# Patient Record
Sex: Female | Born: 1975 | Hispanic: Yes | Marital: Married | Smoking: Never smoker
Health system: Southern US, Community
[De-identification: ages and names within clinical notes are randomized; demographics above are authoritative.]

## PROBLEM LIST (undated history)

## (undated) DIAGNOSIS — D649 Anemia, unspecified: Secondary | ICD-10-CM

## (undated) HISTORY — PX: TUBAL LIGATION: SHX77

## (undated) HISTORY — PX: CHOLECYSTECTOMY: SHX55

---

## 2004-07-23 ENCOUNTER — Inpatient Hospital Stay (HOSPITAL_COMMUNITY): Admission: RE | Admit: 2004-07-23 | Discharge: 2004-07-25 | Payer: Self-pay | Admitting: Obstetrics and Gynecology

## 2006-09-04 ENCOUNTER — Emergency Department (HOSPITAL_COMMUNITY): Admission: EM | Admit: 2006-09-04 | Discharge: 2006-09-04 | Payer: Self-pay | Admitting: Emergency Medicine

## 2009-10-09 ENCOUNTER — Ambulatory Visit (HOSPITAL_COMMUNITY): Admission: RE | Admit: 2009-10-09 | Discharge: 2009-10-09 | Payer: Self-pay | Admitting: General Surgery

## 2009-10-15 ENCOUNTER — Encounter (INDEPENDENT_AMBULATORY_CARE_PROVIDER_SITE_OTHER): Payer: Self-pay | Admitting: General Surgery

## 2009-10-15 ENCOUNTER — Observation Stay (HOSPITAL_COMMUNITY): Admission: RE | Admit: 2009-10-15 | Discharge: 2009-10-16 | Payer: Self-pay | Admitting: General Surgery

## 2010-02-09 ENCOUNTER — Encounter (HOSPITAL_COMMUNITY): Admission: RE | Admit: 2010-02-09 | Discharge: 2010-03-11 | Payer: Self-pay | Admitting: Oncology

## 2010-02-09 ENCOUNTER — Ambulatory Visit (HOSPITAL_COMMUNITY): Payer: Self-pay | Admitting: Oncology

## 2010-04-06 ENCOUNTER — Encounter (HOSPITAL_COMMUNITY): Admission: RE | Admit: 2010-04-06 | Discharge: 2010-05-06 | Payer: Self-pay | Admitting: Oncology

## 2010-04-06 ENCOUNTER — Ambulatory Visit (HOSPITAL_COMMUNITY): Payer: Self-pay | Admitting: Oncology

## 2010-04-27 IMAGING — US US ABDOMEN COMPLETE
1 series · 14 of 25 positions shown · non-contrast
Comparison: None.

CLINICAL DATA: Post prandial pain with nausea and vomiting

ABDOMEN ULTRASOUND
TECHNIQUE: Routine

[Series 1: unknown · 0.26mm/px · 14 of 72 slices shown]
[im 1/72]
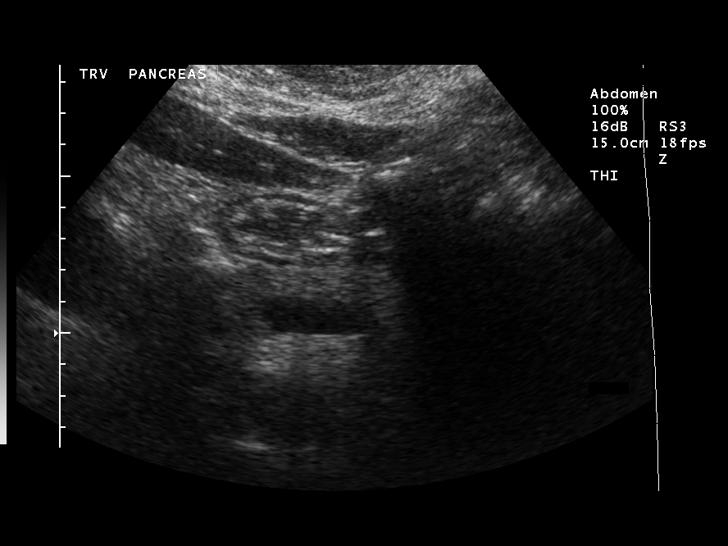
[im 6/72]
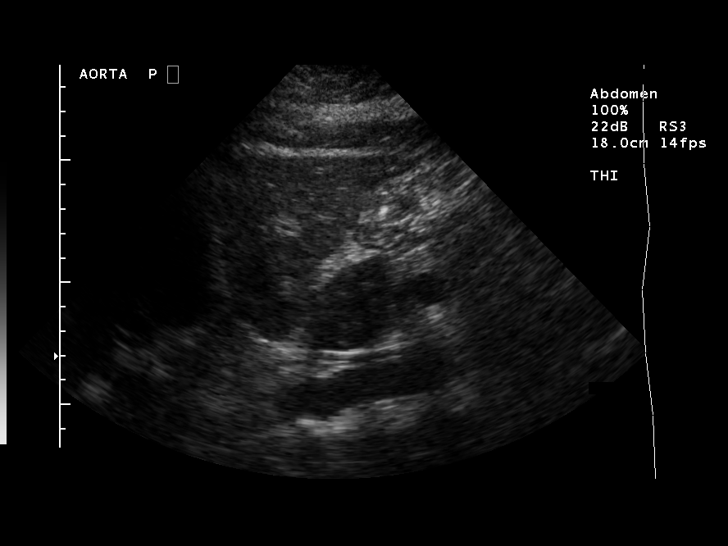
[im 12/72]
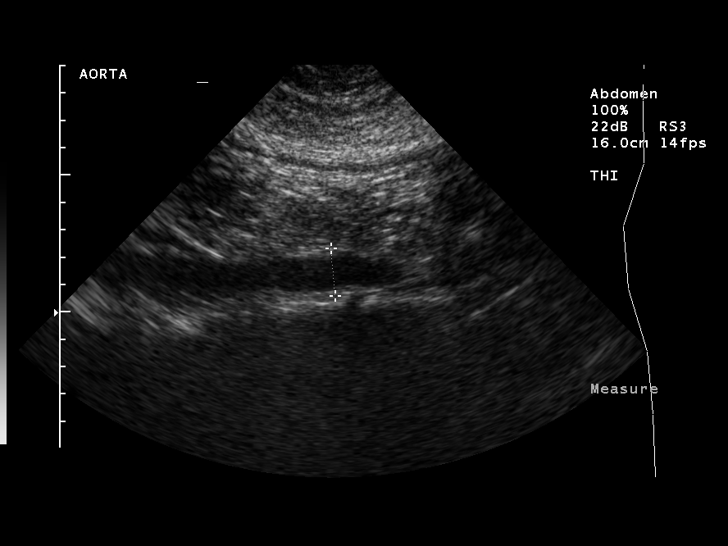
[im 18/72]
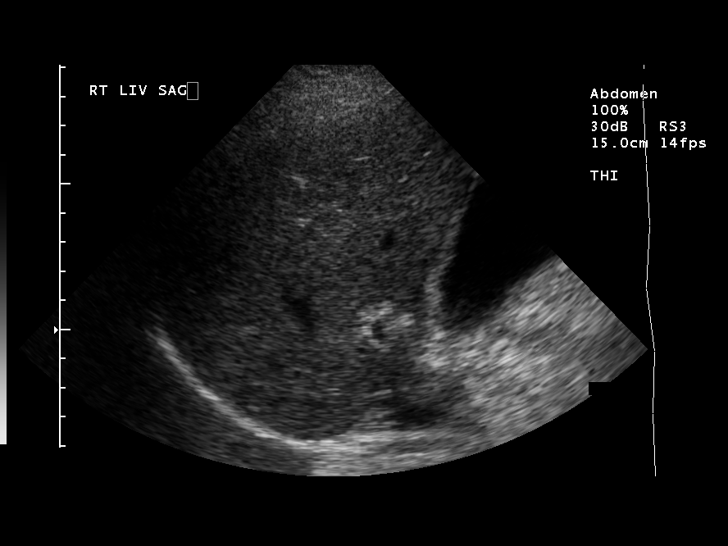
[im 24/72]
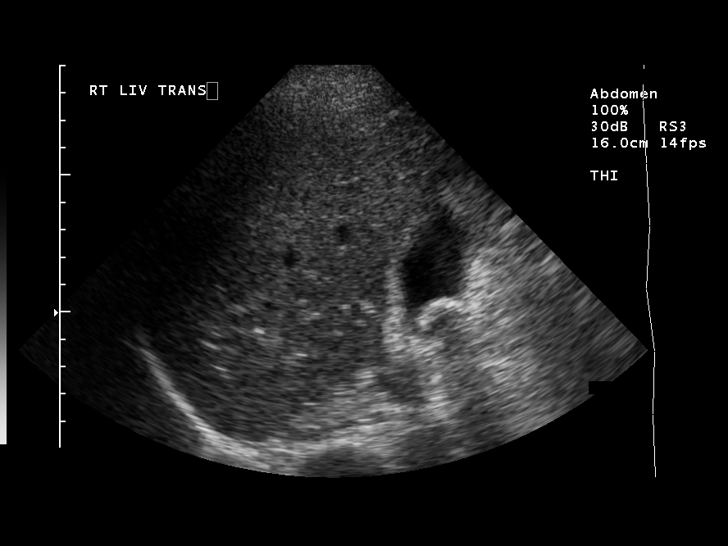
[im 27/72]
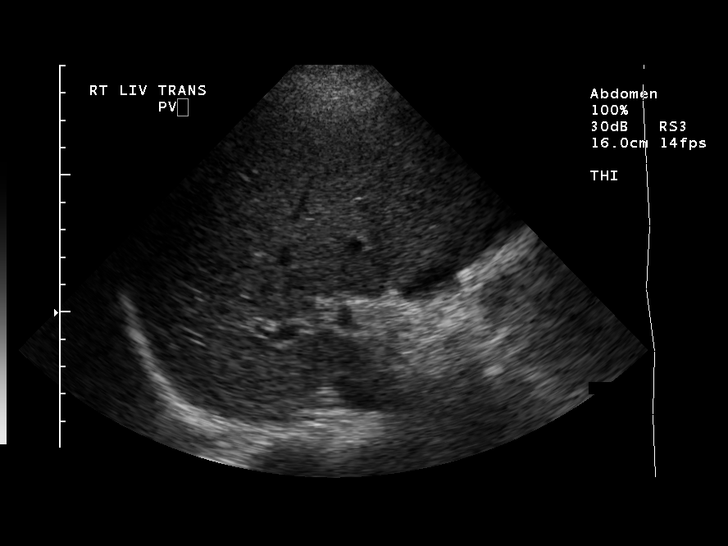
[im 33/72]
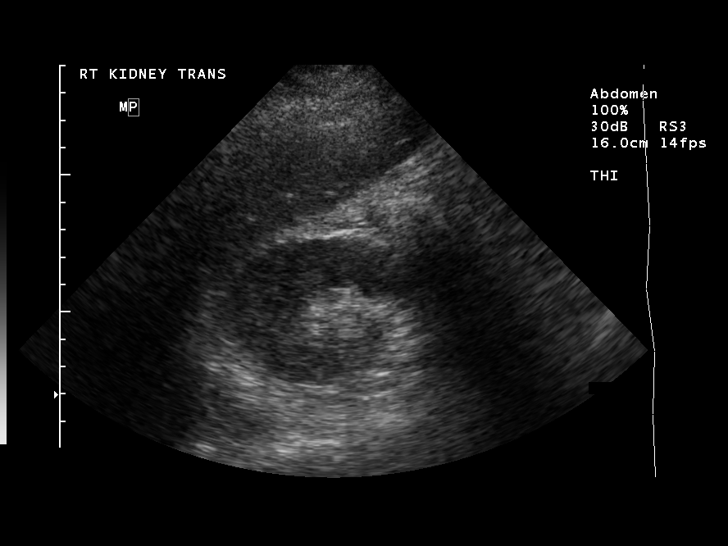
[im 39/72]
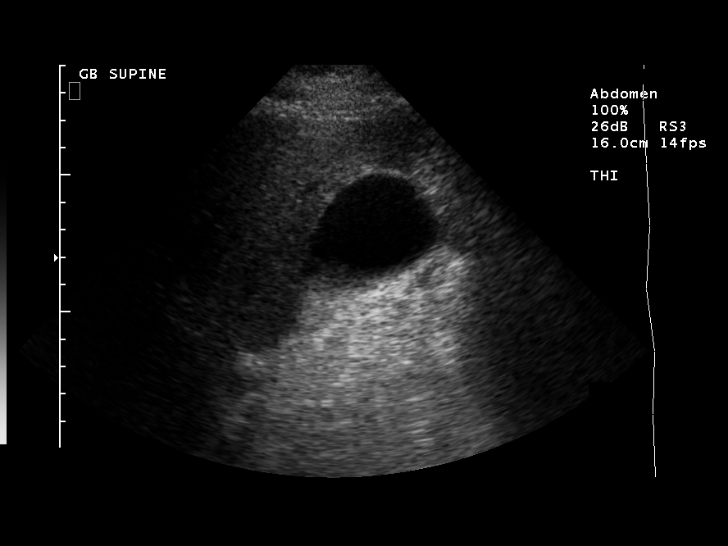
[im 45/72]
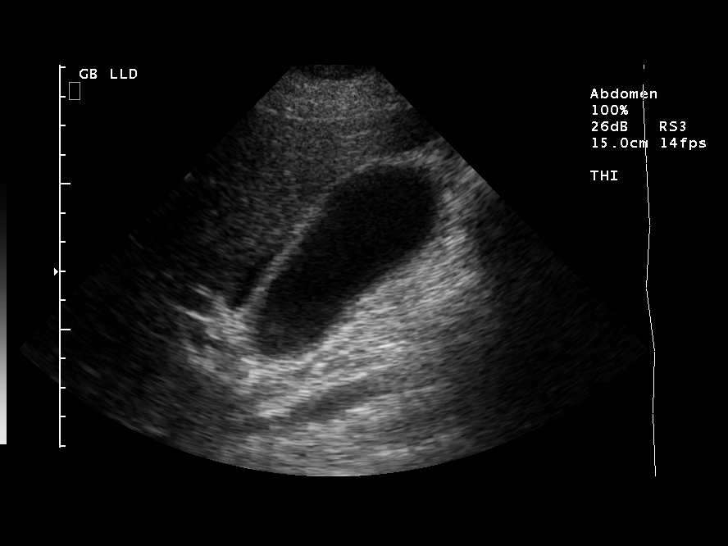
[im 48/72]
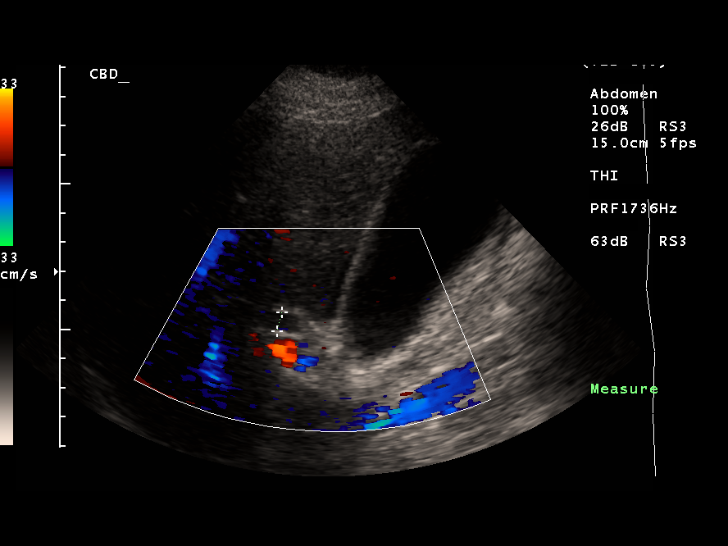
[im 54/72]
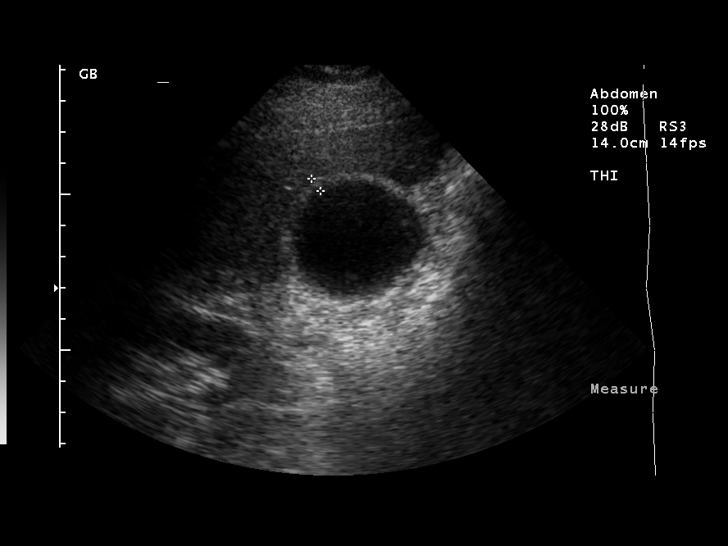
[im 60/72]
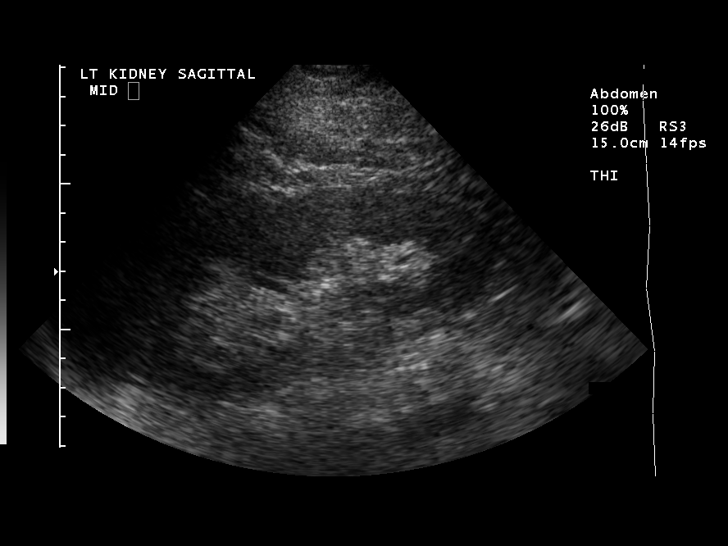
[im 66/72]
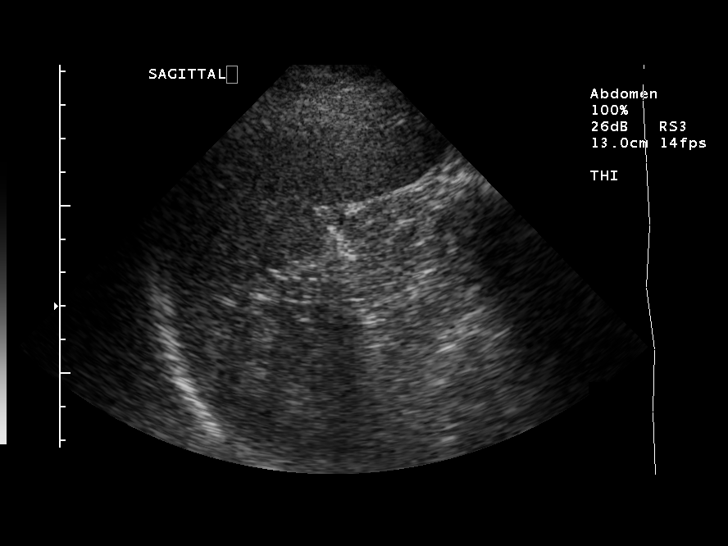
[im 72/72]
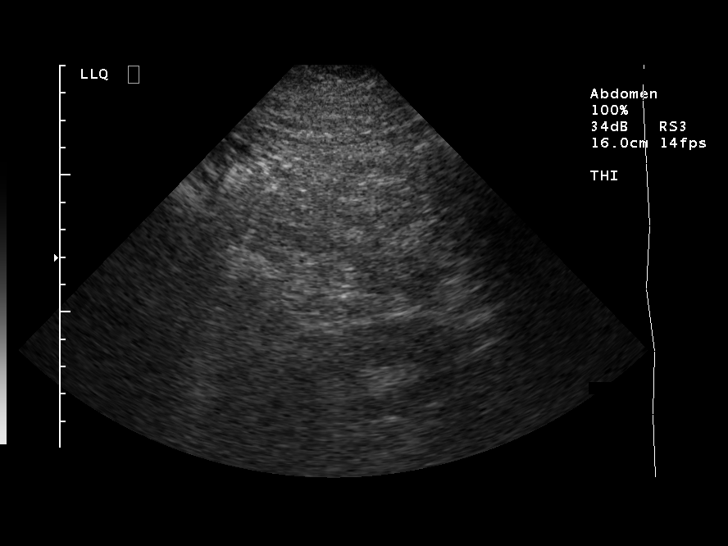

[14 of 25 positions shown; findings below may reference images not displayed]

FINDINGS: There is a 1.7 cm shadowing stone lodged in the neck of
the gallbladder.  There is wall thickening and pericholecystic
fluid.  There is a positive sonographic Murphy's sign.  The common
duct measures 6.7 mm which is slightly enlarged.  No obvious common
duct stone is visualized.  There also is slight dilatation of the
intrahepatic biliary tree.

No focal lesions of the liver or spleen.  Pancreas, aorta, IVC, and
kidneys normal.

These findings were personally phoned to the referring physician.
IMPRESSION: 1.  Cholelithiasis with sonographic changes of acute cholecystitis
as described above.
2.  There is mild intra and extrahepatic biliary dilatation without
demonstration of a common duct stone.

## 2010-08-13 ENCOUNTER — Encounter (HOSPITAL_COMMUNITY): Admission: RE | Admit: 2010-08-13 | Payer: Self-pay | Admitting: Oncology

## 2010-12-27 LAB — HEPATIC FUNCTION PANEL
Albumin: 3.1 g/dL — ABNORMAL LOW (ref 3.5–5.2)
Alkaline Phosphatase: 83 U/L (ref 39–117)
Total Protein: 7 g/dL (ref 6.0–8.3)

## 2010-12-27 LAB — POCT I-STAT 4, (NA,K, GLUC, HGB,HCT)
HCT: 32 % — ABNORMAL LOW (ref 36.0–46.0)
Hemoglobin: 10.9 g/dL — ABNORMAL LOW (ref 12.0–15.0)
Potassium: 4.4 mEq/L (ref 3.5–5.1)
Sodium: 140 mEq/L (ref 135–145)

## 2010-12-27 LAB — CBC
HCT: 33.6 % — ABNORMAL LOW (ref 36.0–46.0)
Hemoglobin: 11.3 g/dL — ABNORMAL LOW (ref 12.0–15.0)
MCV: 63 fL — ABNORMAL LOW (ref 78.0–100.0)
MCV: 73.3 fL — ABNORMAL LOW (ref 78.0–100.0)
Platelets: 323 10*3/uL (ref 150–400)
RBC: 4.59 MIL/uL (ref 3.87–5.11)
RDW: 30.7 % — ABNORMAL HIGH (ref 11.5–15.5)
WBC: 7.3 10*3/uL (ref 4.0–10.5)
WBC: 7 10*3/uL (ref 4.0–10.5)

## 2010-12-27 LAB — BASIC METABOLIC PANEL
BUN: 4 mg/dL — ABNORMAL LOW (ref 6–23)
Creatinine, Ser: 0.48 mg/dL (ref 0.4–1.2)
GFR calc non Af Amer: >60 mL/min (ref >60)

## 2010-12-27 LAB — DIFFERENTIAL
Basophils Absolute: 0 10*3/uL (ref 0.0–0.1)
Eosinophils Absolute: 0 10*3/uL (ref 0.0–0.7)
Lymphs Abs: 0.9 10*3/uL (ref 0.7–4.0)
Neutrophils Relative %: 77 % (ref 43–77)

## 2010-12-29 LAB — CBC
HCT: 24.8 % — ABNORMAL LOW (ref 36.0–46.0)
Hemoglobin: 8.1 g/dL — ABNORMAL LOW (ref 12.0–15.0)
MCV: 59 fL — ABNORMAL LOW (ref 78.0–100.0)
RBC: 4.19 MIL/uL (ref 3.87–5.11)
WBC: 5.8 10*3/uL (ref 4.0–10.5)

## 2010-12-29 LAB — FOLATE: Folate: 14.8 ng/mL

## 2010-12-29 LAB — RETICULOCYTES
RBC.: 4.19 MIL/uL (ref 3.87–5.11)
Retic Ct Pct: 1.7 % (ref 0.4–3.1)

## 2010-12-29 LAB — IRON AND TIBC
Saturation Ratios: 3 % — ABNORMAL LOW (ref 20–55)
UIBC: 520 ug/dL

## 2010-12-29 LAB — FERRITIN: Ferritin: 2 ng/mL — ABNORMAL LOW (ref 10–291)

## 2011-02-26 NOTE — H&P (Signed)
NAME:  Jasmine Townsend, Jasmine Townsend         ACCOUNT NO.:  [id number]   MEDICAL RECORD NO.:  [id number]           PATIENT TYPE:   LOCATION:                                 FACILITY:   PHYSICIAN:  Tilda Burrow, M.D.      DATE OF BIRTH:   DATE OF ADMISSION:  DATE OF DISCHARGE:  LH                                HISTORY & PHYSICAL   ADMITTING DIAGNOSES:  1.  Pregnancy, 38-1/[redacted] weeks gestation.  2.  Prior cesarean section, not for trial of labor.  3.  Desire for elective permanent sterilization.   HISTORY:  A Spanish speaking patient only.  This is a 35 year old female,  gravida 4, para 3, AB 0 with two vaginal deliveries in [place], followed by  cesarean section under spinal anesthesia in 2001, with no records available,  was admitted for repeat cesarean section and tubal ligation.  Ms. Jasmine Townsend has  been followed through our office, with last menstrual period ?January 16,  suggesting October 21 [facility].  A 15-week ultrasound showed an [facility] of July 28, 2004, and 23-week ultrasound suggests July 24, 2004.  The patient is  admitted at 39+ weeks by the ultrasound criteria for repeat cesarean section  and tubal ligation.  She requests permanent sterilization and on more than  one occasion, we have confirmed her understanding that the procedure is  permanent, and involves cutting the tubes.  (cortada de los trompas).   ALLERGIES:  She has no allergies.   SOCIAL HISTORY:  She is married.  Stable relationship to Jasmine Townsend.   PRENATAL COURSE:  Notable for blood type O positive.  GBS negative.  Rubella  immune.   LABORATORY DATA:  Hemoglobin 12, hematocrit 39, hepatitis, HIV, GC chlamydia  and RPR are all negative.  Pap smear normal.  Glucose tolerance test 123 mg  percent.   PHYSICAL EXAMINATION:  Shows term-size fetus, vertex presentation, cervix is  closed, long, vertex presentation confirmed.  Extremities are within normal  limits.  Fetal movement is reported by the patient  as good.   PLAN:  Repeat cesarean section and tubal ligation, July 23, 2004.                                                           Tilda Burrow, M.D.  Electronically Signed    JVF/MEDQ  D:  07/21/2004  T:  07/21/2004  Job:  [id number]   cc:   Francoise Schaumann. Halm, D.O.  [address], [address]  [place]  [place] [address]  Fax: [phone]

## 2011-02-26 NOTE — Discharge Summary (Signed)
NAMELUCENDIA, LEARD               ACCOUNT NO.:  [id number]   MEDICAL RECORD NO.:  [id number]          PATIENT TYPE:  INP   LOCATION:  A413                          FACILITY:  [facility]   PHYSICIAN:  Tilda Burrow, M.D. DATE OF BIRTH:  1975/12/17   DATE OF ADMISSION:  07/23/2004  DATE OF DISCHARGE:  LH                                 DISCHARGE SUMMARY   ADMITTING DIAGNOSES:  1.  Pregnancy with 38-1/[redacted] weeks gestation.  2.  Repeat cesarean section, not for trial of labor.  3.  Desire for elective sterilization.   DISCHARGE DIAGNOSES:  1.  Pregnancy with 38-1/[redacted] weeks gestation.  2.  Repeat cesarean section, not for trial of labor.  3.  Desire for elective sterilization.   PROCEDURE:  Repeat low-transverse cervical cesarean section, bilateral  partial salpingectomy performed July 23, 2004.   DISCHARGE MEDICATIONS:  1.  Tylox 1-2h. p.r.n. pain, dispense 20.  2.  Motrin 200 mg, two every four hours p.r.n. mild pain.   FOLLOWUP:  In one week with staple removal.   HOSPITAL SUMMARY:  This healthy 35 year old female with 38-1/[redacted] weeks  gestation was admitted to repeat cesarean section.  She has one prior  cesarean and two vaginal deliveries in the past.  She was admitted through  [facility] with hemoglobin of 12, hematocrit 36, blood type O positive.  She underwent repeat cesarean section and tubal ligation with excision of  the midline cicatrix on July 23, 2004.  She then proceeded to an  uncomplicated postoperative course x2 days.  She had an uneventful afebrile  course with postpartum hemoglobin of 10.6, hematocrit 31.9.  The baby and  patient were discharged on July 25, 2004.  Followup one week staple  removal.     John   JVF/MEDQ  D:  07/25/2004  T:  07/25/2004  Job:  [id number]   cc:   [facility] Department

## 2011-02-26 NOTE — Op Note (Signed)
Jasmine Townsend, Jasmine Townsend               ACCOUNT NO.:  [id number]   MEDICAL RECORD NO.:  [id number]          PATIENT TYPE:  INP   LOCATION:  A413                          FACILITY:  [facility]   PHYSICIAN:  Tilda Burrow, M.D. DATE OF BIRTH:  [birth year]   DATE OF PROCEDURE:  07/23/2004  DATE OF DISCHARGE:                                 OPERATIVE REPORT   PREOPERATIVE DIAGNOSES:  1.  Pregnancy, 38-1/[redacted] weeks gestation, repeat cesarean section, not for      trial of labor.  2.  Desire for elective permanent sterilization.   POSTOPERATIVE DIAGNOSES:  1.  Pregnancy, 38-1/2 weeks' gestation, repeat cesarean section, not for      trial of labor.  2.  Desire for elective permanent sterilization.   PROCEDURE:  Repeat low transverse cervical cesarean section.  Bilateral  Partial Salpingectomy   SURGEON:  Tilda Burrow, M.D.   ASSISTANT:  Earlene Plater, R.N., Asencion Noble, C.S.T.-F.A.   ANESTHESIA:  Spinal, Garner Nash, C.R.N.A.   COMPLICATIONS:  None.   FINDINGS:  Healthy female infant, Apgars 9 and 9.  Normal-appearing tubes  bilaterally.  Very high bladder flap on lower uterine segment.   DETAILS OF PROCEDURE:  The patient was taken to the operating room and  prepped and draped in the usual fashion for lower abdominal surgery.  The  old vertical midline cicatrix was incised and the peritoneal cavity entered  in the midline.  Bladder flap was developed from its very high positioning  on the lower uterine segment and a transverse uterine incision performed  using knife dissection, with finger traction used to complete the extension  of the transverse incision.  The fetal vertex was rotated into the incision,  delivered easily using fundal pressure guidance and massage of tissue from  around the vertex.  Infant's head delivered.  Infant cried promptly.  Amniotic fluid was clear.  The baby's body was then delivered without  difficulty and cord clamped and the infant passed to the waiting physician,  Francoise Schaumann. Halm, D.O., for subsequent care.  See his notes for further  details on the baby.   The cord blood samples were obtained, the uterus massaged, and the placenta  delivered by Crede massage, resulting in Hollis presentation of the  placenta and intact membranes.  The uterine irrigation with antibiotic  solution was followed by a single-layer running locking closure of the  uterine incision.  The bladder flap was reapproximated with 2-0 chromic.   The abdomen was irrigated, the tubes then disrupted by placement of a double  ligature around a midsegment knuckle of each tube with excision of the  incarcerated knuckle of tube and suture placed on the right tube for  histology confirmation of surgical success.   The abdomen was then closed with 2-0 chromic closure of the peritoneum and  continuous running monofilament Novofil closure of the fascial layer.  The  subcu tissues were then reapproximated with transverse releasing incisions  performed in the subcu fatty tissue to allow a more effective scar  reapproximation.  Two-layer subcutaneous closure was performed with  interrupted 2-0 plain sutures, then  staple closure of the skin resulted in a  satisfactory cosmetic appearance to the incision.  A flat JP drain was  placed in the subcu space and allowed to exit through a stab incision on the  right lower quadrant.  This was sutured in place with 2-0 Monofilament  suture and the patient went to the recovery room in good condition with  estimated blood loss of 600 mL, sponge and needle counts being correct.     John   JVF/MEDQ  D:  07/23/2004  T:  07/23/2004  Job:  [id number]   cc:   [facility] Department   Francoise Schaumann. Halm, D.O.  [address], [address]  [place]  [place] [address]  Fax: [phone]

## 2014-08-19 ENCOUNTER — Encounter (HOSPITAL_COMMUNITY): Payer: Self-pay | Admitting: *Deleted

## 2014-08-19 ENCOUNTER — Other Ambulatory Visit (HOSPITAL_COMMUNITY): Payer: Self-pay | Admitting: Physician Assistant

## 2014-08-19 ENCOUNTER — Emergency Department (HOSPITAL_COMMUNITY)
Admission: EM | Admit: 2014-08-19 | Discharge: 2014-08-19 | Disposition: A | Discharge status: Home / Self Care | Payer: Self-pay | Attending: Emergency Medicine | Admitting: Emergency Medicine

## 2014-08-19 DIAGNOSIS — N939 Abnormal uterine and vaginal bleeding, unspecified: Secondary | ICD-10-CM | POA: Insufficient documentation

## 2014-08-19 DIAGNOSIS — Z9889 Other specified postprocedural states: Secondary | ICD-10-CM | POA: Insufficient documentation

## 2014-08-19 DIAGNOSIS — Z9851 Tubal ligation status: Secondary | ICD-10-CM | POA: Insufficient documentation

## 2014-08-19 DIAGNOSIS — N921 Excessive and frequent menstruation with irregular cycle: Secondary | ICD-10-CM

## 2014-08-19 DIAGNOSIS — Z862 Personal history of diseases of the blood and blood-forming organs and certain disorders involving the immune mechanism: Secondary | ICD-10-CM | POA: Insufficient documentation

## 2014-08-19 HISTORY — DX: Anemia, unspecified: D64.9

## 2014-08-19 LAB — HEPATIC FUNCTION PANEL
ALBUMIN: 4 g/dL (ref 3.5–5.2)
ALT: 14 U/L (ref 0–35)
AST: 16 U/L (ref 0–37)
Alkaline Phosphatase: 101 U/L (ref 39–117)
BILIRUBIN TOTAL: 0.6 mg/dL (ref 0.3–1.2)
Bilirubin, Direct: <0.2 mg/dL (ref 0.0–0.3)
Total Protein: 8 g/dL (ref 6.0–8.3)

## 2014-08-19 LAB — CBC WITH DIFFERENTIAL/PLATELET
BASOS ABS: 0 10*3/uL (ref 0.0–0.1)
Basophils Relative: 1 % (ref 0–1)
Eosinophils Absolute: 0.1 10*3/uL (ref 0.0–0.7)
Eosinophils Relative: 1 % (ref 0–5)
HEMATOCRIT: 24.9 % — AB (ref 36.0–46.0)
HEMOGLOBIN: 7.4 g/dL — AB (ref 12.0–15.0)
LYMPHS ABS: 1.4 10*3/uL (ref 0.7–4.0)
LYMPHS PCT: 26 % (ref 12–46)
MCH: 17.1 pg — ABNORMAL LOW (ref 26.0–34.0)
MCHC: 29.7 g/dL — ABNORMAL LOW (ref 30.0–36.0)
MCV: 57.4 fL — ABNORMAL LOW (ref 78.0–100.0)
MONO ABS: 0.5 10*3/uL (ref 0.1–1.0)
MONOS PCT: 10 % (ref 3–12)
NEUTROS ABS: 3.4 10*3/uL (ref 1.7–7.7)
Neutrophils Relative %: 62 % (ref 43–77)
RBC: 4.34 MIL/uL (ref 3.87–5.11)
RDW: 22.6 % — AB (ref 11.5–15.5)
WBC: 5.5 10*3/uL (ref 4.0–10.5)

## 2014-08-19 LAB — BASIC METABOLIC PANEL
ANION GAP: 10 (ref 5–15)
BUN: 8 mg/dL (ref 6–23)
CHLORIDE: 102 meq/L (ref 96–112)
CO2: 25 mEq/L (ref 19–32)
Calcium: 9.4 mg/dL (ref 8.4–10.5)
Creatinine, Ser: 0.44 mg/dL — ABNORMAL LOW (ref 0.50–1.10)
GFR calc non Af Amer: >90 mL/min (ref >90)
Glucose, Bld: 87 mg/dL (ref 70–99)
POTASSIUM: 4 meq/L (ref 3.7–5.3)
Sodium: 137 mEq/L (ref 137–147)

## 2014-08-19 MED ORDER — FERROUS SULFATE 325 (65 FE) MG PO TABS: ORAL_TABLET | ORAL | Status: DC

## 2014-08-19 NOTE — ED Notes (Addendum)
Feels weak an tired , dizzy at times.  Hgb 6.8  No NVD.  Heavy periods  Sent from [facility].

## 2014-08-19 NOTE — Discharge Instructions (Signed)
Follow up with dr. Glo Herring this week or next week.

## 2014-08-19 NOTE — ED Provider Notes (Signed)
CSN: [id number]     Arrival date & time 08/19/14  1226 History  This chart was scribed for Maudry Diego, MD by Chester Holstein, ED Scribe. This patient was seen in room APA10/APA10 and the patient's care was started at 2:04 PM.   Chief Complaint  Patient presents with  . Weakness   Patient is a 38 y.o. female presenting with vaginal bleeding. The history is provided by the patient. No language interpreter was used.  Vaginal Bleeding Severity:  Mild Onset quality:  Sudden Duration:  3 weeks Timing:  Constant Progression:  Partially resolved Chronicity:  New Menstrual history:  Regular Possible pregnancy: no   Context: spontaneously (with period)   Relieved by:  Nothing Worsened by:  Nothing tried Ineffective treatments:  None tried Associated symptoms: no abdominal pain, no back pain and no fatigue    HPI Comments: Jasmine Townsend is a 39 y.o. female who presents to the Emergency Department complaining of constant heavy period for 3 weeks.  Bleeding has slowed in last 2 days. Pt denies any associated pain. Pt has past surgical history of 2 C-sections, tubal ligation, and cholecystectomy, but has no prior history of lengthy periods.    Past Medical History  Diagnosis Date  . Anemia   . Blood transfusion without reported diagnosis    Past Surgical History  Procedure Laterality Date  . Cesarean section    . Tubal ligation     History reviewed. No pertinent family history. History  Substance Use Topics  . Smoking status: Never Smoker   . Smokeless tobacco: Not on file  . Alcohol Use: No   OB History    No data available     Review of Systems  Constitutional: Negative for appetite change and fatigue.  HENT: Negative for congestion, ear discharge and sinus pressure.   Eyes: Negative for discharge.  Respiratory: Negative for cough.   Cardiovascular: Negative for chest pain.  Gastrointestinal: Negative for abdominal pain and diarrhea.  Genitourinary: Positive for  vaginal bleeding. Negative for frequency, hematuria, vaginal pain and pelvic pain.  Musculoskeletal: Negative for back pain.  Skin: Negative for rash.  Neurological: Negative for seizures and headaches.  Psychiatric/Behavioral: Negative for hallucinations.      Allergies  Review of patient's allergies indicates not on file.  Home Medications   Prior to Admission medications   Not on File   BP 110/69 mmHg  Pulse 70  Temp(Src) 98.4 F (36.9 C) (Oral)  Resp 18  Wt 228 lb (103.42 kg)  SpO2 100%  LMP  Physical Exam  Constitutional: She is oriented to person, place, and time. She appears well-developed.  HENT:  Head: Normocephalic.  Eyes: Conjunctivae and EOM are normal. No scleral icterus.  Neck: Neck supple. No thyromegaly present.  Cardiovascular: Normal rate and regular rhythm.  Exam reveals no gallop and no friction rub.   No murmur heard. Pulmonary/Chest: Effort normal. No stridor. She has no wheezes. She has no rales. She exhibits no tenderness.  Abdominal: She exhibits no distension. There is no tenderness. There is no rebound.  Musculoskeletal: Normal range of motion. She exhibits no edema.  Lymphadenopathy:    She has no cervical adenopathy.  Neurological: She is alert and oriented to person, place, and time. She exhibits normal muscle tone. Coordination normal.  Skin: No rash noted. No erythema.  Psychiatric: She has a normal mood and affect. Her behavior is normal.  Nursing note and vitals reviewed.   ED Course  Procedures (including critical care  time) DIAGNOSTIC STUDIES:     COORDINATION OF CARE: 2:06 PM Discussed treatment plan with patient at beside, the patient agrees with the plan and has no further questions at this time.   Labs Review Labs Reviewed  CBC WITH DIFFERENTIAL  BASIC METABOLIC PANEL    Imaging Review No results found.   EKG Interpretation None      MDM   Final diagnoses:  None   Pt started on iron and will follow up with  gyn md The chart was scribed for me under my direct supervision.  I personally performed the history, physical, and medical decision making and all procedures in the evaluation of this patient.Maudry Diego, MD 08/19/14 [phone]

## 2014-08-20 ENCOUNTER — Other Ambulatory Visit (HOSPITAL_COMMUNITY): Payer: Self-pay | Admitting: Physician Assistant

## 2014-08-20 DIAGNOSIS — N921 Excessive and frequent menstruation with irregular cycle: Secondary | ICD-10-CM

## 2014-08-21 ENCOUNTER — Ambulatory Visit (HOSPITAL_COMMUNITY)
Admission: RE | Admit: 2014-08-21 | Discharge: 2014-08-21 | Disposition: A | Discharge status: Home / Self Care | Payer: Self-pay | Source: Ambulatory Visit | Attending: Physician Assistant | Admitting: Physician Assistant

## 2014-08-21 ENCOUNTER — Ambulatory Visit (HOSPITAL_COMMUNITY): Payer: Self-pay

## 2014-08-21 DIAGNOSIS — N921 Excessive and frequent menstruation with irregular cycle: Secondary | ICD-10-CM | POA: Insufficient documentation

## 2014-09-30 ENCOUNTER — Encounter: Payer: Self-pay | Admitting: *Deleted

## 2014-09-30 DIAGNOSIS — R9389 Abnormal findings on diagnostic imaging of other specified body structures: Secondary | ICD-10-CM

## 2014-09-30 DIAGNOSIS — N921 Excessive and frequent menstruation with irregular cycle: Secondary | ICD-10-CM | POA: Insufficient documentation

## 2014-10-02 ENCOUNTER — Ambulatory Visit (INDEPENDENT_AMBULATORY_CARE_PROVIDER_SITE_OTHER): Payer: Self-pay | Admitting: Obstetrics and Gynecology

## 2014-10-02 VITALS — BP 120/70 | Wt 222.5 lb

## 2014-10-02 DIAGNOSIS — N920 Excessive and frequent menstruation with regular cycle: Secondary | ICD-10-CM | POA: Insufficient documentation

## 2014-10-02 MED ORDER — NORGESTIMATE-ETH ESTRADIOL 0.25-35 MG-MCG PO TABS
1.0000 | ORAL_TABLET | Freq: Every day | ORAL | Status: DC
Start: 2014-10-02 — End: 2017-05-02

## 2014-10-02 NOTE — Progress Notes (Signed)
Patient ID: Jasmine Townsend, female   DOB: October 25, 1975, 39 y.o.   MRN: [id number] Pt here today for thickened endometrium. Pt states that she was bleeding and then went ot the hospital and they gave her megace and she has been taking that since and has not had anymore bleeding. Pt referred here for evaluation, radiology suggestsed a SHG and endometrial biopsy.    [facility] Visit  Patient name: Jasmine Townsend MRN [id number]  Date of birth: 01/20/1976  CC & HPI:  [name] is a 38 y.o. female presenting today for heavy bleeding and anemia. Has taken megace, and now no bleeding, and now anemia is resolved. [facility]  ROS:  Prior menses lasting 3-5 days , described as heavy.  Pertinent History Reviewed:   Reviewed: Significant for u/s shows small intramural fibroids.  Medical         Past Medical History  Diagnosis Date  . Anemia   . Blood transfusion without reported diagnosis                               Surgical Hx:    Past Surgical History  Procedure Laterality Date  . Cesarean section    . Tubal ligation     Medications: Reviewed & Updated - see associated section                      Current outpatient prescriptions: ferrous sulfate 325 (65 FE) MG tablet, Take 3 tablets a day, Disp: 100 tablet, Rfl: 0;  megestrol (MEGACE) 40 MG tablet, Take 40 mg by mouth daily., Disp: , Rfl: ;  ibuprofen (ADVIL,MOTRIN) 200 MG tablet, Take 400-600 mg by mouth every 6 (six) hours as needed for moderate pain., Disp: , Rfl:    Social History: Reviewed -  reports that she has never smoked. She does not have any smokeless tobacco history on file.  Objective Findings:  Vitals: Blood pressure 120/70, weight 222 lb 8 oz (100.925 kg).  Physical Examination: General appearance - alert, well appearing, and in no distress, oriented to person, place, and time and overweight Mental status - alert, oriented to person, place, and time, normal mood,  behavior, speech, dress, motor activity, and thought processes Eyes - pupils equal and reactive, extraocular eye movements intact U/s reviewed.  Assessment & Plan:   A: menorrhagia with anemia    Small intramural fibroids p trial oral contraceptive s Rx Sprintec F/u 2 months 1. Menorrhagia.  2. Small ut fibroids, intramural 3. Normal paps at health dept  P:  1. Trial [facility]c [facility].

## 2014-12-03 ENCOUNTER — Ambulatory Visit: Payer: Self-pay | Admitting: Obstetrics and Gynecology

## 2014-12-11 ENCOUNTER — Ambulatory Visit: Payer: Self-pay | Admitting: Obstetrics and Gynecology

## 2014-12-12 ENCOUNTER — Ambulatory Visit (INDEPENDENT_AMBULATORY_CARE_PROVIDER_SITE_OTHER): Payer: Self-pay | Admitting: Obstetrics and Gynecology

## 2014-12-12 ENCOUNTER — Encounter: Payer: Self-pay | Admitting: Obstetrics and Gynecology

## 2014-12-12 VITALS — BP 120/76 | HR 64 | Wt 233.0 lb

## 2014-12-12 DIAGNOSIS — N840 Polyp of corpus uteri: Secondary | ICD-10-CM | POA: Insufficient documentation

## 2014-12-12 DIAGNOSIS — N921 Excessive and frequent menstruation with irregular cycle: Secondary | ICD-10-CM | POA: Insufficient documentation

## 2014-12-12 NOTE — Patient Instructions (Signed)
Jasmine Townsend will schedule a ultrasound called a sonohysterogram, to determine the size and location of a polyp in the uterus.\

## 2014-12-12 NOTE — Progress Notes (Signed)
Patient ID: Jasmine Townsend, female   DOB: 1976/06/12, 39 y.o.   MRN: [id number] Pt here today for follow up form last visit. Pt states that she has still had irregular bleeding and the periods last a long time. Pt kept a period calendar like Dr. Glo Herring asked her to. Pt states that her periods are just crazy and all over the place.    [facility] Visit  Patient name: Jasmine Townsend MRN [id number]  Date of birth: 01-Feb-1976  CC & HPI:  Jasmine Townsend is a 39 y.o. female presenting today for followup for heavy prolonged menses.  ROS:  Cannot take pills. "not good for me" got pregnant on ocp, also pregnant on Depo.  Pertinent History Reviewed:   Reviewed: Significant for BTL, after 2nd c/s. Medical         Past Medical History  Diagnosis Date  . Anemia   . Blood transfusion without reported diagnosis                               Surgical Hx:    Past Surgical History  Procedure Laterality Date  . Cesarean section    . Tubal ligation     Medications: Reviewed & Updated - see associated section                       Current outpatient prescriptions:  .  ferrous sulfate 325 (65 FE) MG tablet, Take 3 tablets a day, Disp: 100 tablet, Rfl: 0 .  norgestimate-ethinyl estradiol (ORTHO-CYCLEN,SPRINTEC,PREVIFEM) 0.25-35 MG-MCG tablet, Take 1 tablet by mouth daily., Disp: 1 Package, Rfl: 11 .  ibuprofen (ADVIL,MOTRIN) 200 MG tablet, Take 400-600 mg by mouth every 6 (six) hours as needed for moderate pain., Disp: , Rfl:  .  megestrol (MEGACE) 40 MG tablet, Take 40 mg by mouth daily., Disp: , Rfl:    Social History: Reviewed -  reports that she has never smoked. She does not have any smokeless tobacco history on file.  Objective Findings:  Vitals: Blood pressure 120/76, pulse 64, weight 233 lb (105.688 kg), last menstrual period 12/01/2014.  Physical Examination: General appearance - alert, well appearing, and in no distress, oriented to person, place, and time and  overweight Mental status - alert, oriented to person, place, and time, normal mood, behavior, speech, dress, motor activity, and thought processes Eyes - pupils equal and reactive, extraocular eye movements intact  U/s from Nov raised the ? Of endometrial polyp Assessment & Plan:   A:  1. haeavy and prolonged menses with ? Endometrial polyp 2 anemia, intermittent   P:  1. schedule SHG 2. Might be candidate for hysteroscopy and removal of polyp

## 2014-12-20 ENCOUNTER — Ambulatory Visit (HOSPITAL_COMMUNITY): Payer: Self-pay | Attending: Obstetrics and Gynecology

## 2015-01-06 ENCOUNTER — Ambulatory Visit: Payer: Self-pay | Admitting: Obstetrics and Gynecology

## 2015-01-16 ENCOUNTER — Other Ambulatory Visit (HOSPITAL_COMMUNITY): Payer: Self-pay

## 2015-01-16 ENCOUNTER — Other Ambulatory Visit: Payer: Self-pay | Admitting: Obstetrics and Gynecology

## 2015-01-16 ENCOUNTER — Ambulatory Visit (HOSPITAL_COMMUNITY)
Admission: RE | Admit: 2015-01-16 | Discharge: 2015-01-16 | Disposition: A | Discharge status: Home / Self Care | Payer: Self-pay | Source: Ambulatory Visit | Attending: Obstetrics and Gynecology | Admitting: Obstetrics and Gynecology

## 2015-01-16 DIAGNOSIS — N921 Excessive and frequent menstruation with irregular cycle: Secondary | ICD-10-CM

## 2015-01-16 DIAGNOSIS — N840 Polyp of corpus uteri: Secondary | ICD-10-CM

## 2015-01-16 DIAGNOSIS — N920 Excessive and frequent menstruation with regular cycle: Secondary | ICD-10-CM | POA: Insufficient documentation

## 2015-01-16 LAB — POCT PREGNANCY, URINE: PREG TEST UR: NEGATIVE

## 2015-01-16 MED ORDER — POVIDONE-IODINE 10 % EX SOLN
CUTANEOUS | Status: AC
  Filled 2015-01-16: qty 30

## 2015-01-30 ENCOUNTER — Encounter: Payer: Self-pay | Admitting: Obstetrics and Gynecology

## 2015-01-30 ENCOUNTER — Ambulatory Visit (INDEPENDENT_AMBULATORY_CARE_PROVIDER_SITE_OTHER): Payer: Self-pay | Admitting: Obstetrics and Gynecology

## 2015-01-30 VITALS — BP 110/60 | Ht 64.0 in | Wt 236.0 lb

## 2015-01-30 DIAGNOSIS — N920 Excessive and frequent menstruation with regular cycle: Secondary | ICD-10-CM

## 2015-01-30 NOTE — Progress Notes (Signed)
Patient ID: Jasmine Townsend, female   DOB: Apr 02, 1976, 39 y.o.   MRN: [id number] Pt here today for follow up. Pt states that she has been taking her BC pills at night before she goes to bed and Monday night about an hour after she took her pill she vomited and continue for a few hours after that. Pt states that she has had trouble in the past taking BC pills because of nausea. Pt states that she has already discussed the HSG results with Dr. Glo Herring and that she is not very sure what today's appointment is really for.

## 2015-01-30 NOTE — Progress Notes (Signed)
Patient ID: Jasmine Townsend, female   DOB: June 10, 1976, 39 y.o.   MRN: [id number]  This chart was scribed for Jonnie Kind, MD by Donato Schultz, ED Scribe. The patient's care was started at 10:00 AM.    [facility] Visit  Patient name: Jasmine Townsend MRN [id number]  Date of birth: 10/18/1975  CC & HPI:  Jasmine Townsend is a 39 y.o. female presenting today for follow-up.  In January she bled for 15 days, February she bled for 7 days, and in March she bled for 5 days after she started taking birth control.  At the beginning of her menses, she experiences heavy vaginal bleeding and clotting however towards the end of her menses, her flow becomes significantly lighter.  She states that she has to change her sanitary pad every couple of hours due to the amount of bleeding.  She has been taking her birth control pill as prescribed but states that she experiences a lot of nausea.  She has used an IUD in the past but had to remove it due to complications.   NO IMP or PCB Pt has had recent hsg that ruled out endometrial polyps, endometrium was thin.  ROS:  All systems have been reviewed and are negative unless otherwise indicated in the HPI.    Pertinent History Reviewed:   Reviewed: Significant for cesarean section, tubal ligation Medical         Past Medical History  Diagnosis Date  . Anemia   . Blood transfusion without reported diagnosis                               Surgical Hx:    Past Surgical History  Procedure Laterality Date  . Cesarean section    . Tubal ligation     Medications: Reviewed & Updated - see associated section                       Current outpatient prescriptions:  .  ferrous sulfate 325 (65 FE) MG tablet, Take 3 tablets a day, Disp: 100 tablet, Rfl: 0 .  norgestimate-ethinyl estradiol (ORTHO-CYCLEN,SPRINTEC,PREVIFEM) 0.25-35 MG-MCG tablet, Take 1 tablet by mouth daily., Disp: 1 Package, Rfl: 11   Social History: Reviewed -  reports that she  has never smoked. She has never used smokeless tobacco.  Objective Findings:  Vitals: Blood pressure 110/60, height 5\' 4"  (1.626 m), weight 236 lb (107.049 kg), last menstrual period 01/26/2015.  Physical Examination: discussion only x 15 min   Assessment & Plan:   A:  1. Menorrhagia , adequately controlled on ocp  P:  1. Use OCP with continuous method x 3 mos at a time then 1 wk break/109mo

## 2015-03-09 IMAGING — US US PELVIS COMPLETE
1 series · 13 of 25 positions shown · non-contrast
Comparison: None

CLINICAL DATA: Metrorrhagia



[Series 1: us pelvis complete · 0.21mm/px · 13 of 141 slices shown]
[im 1/141]
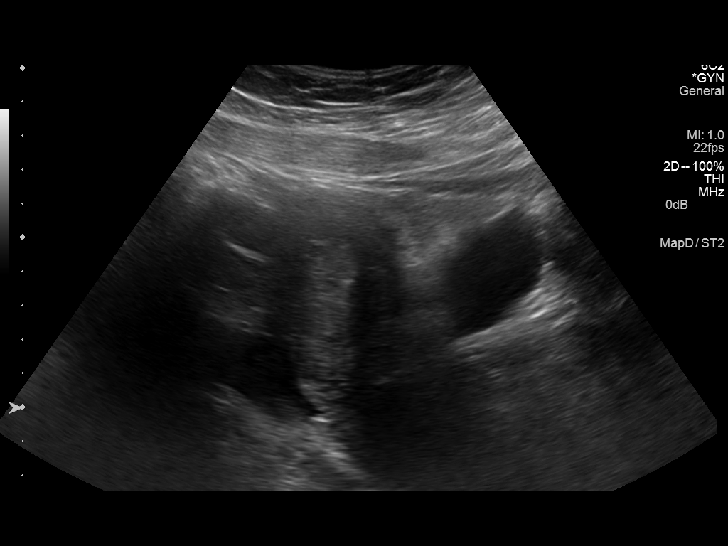
[im 12/141]
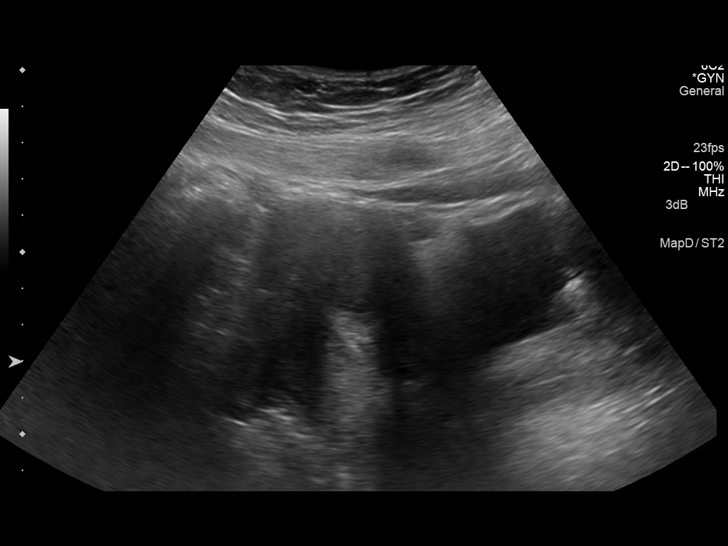
[im 24/141]
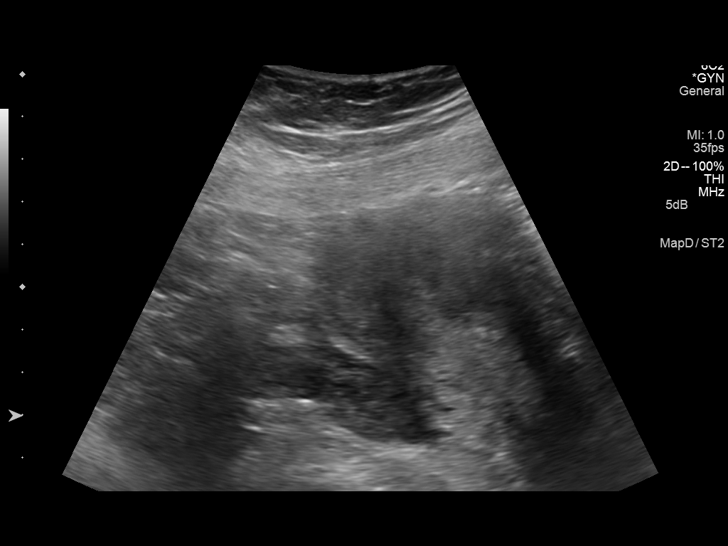
[im 36/141]
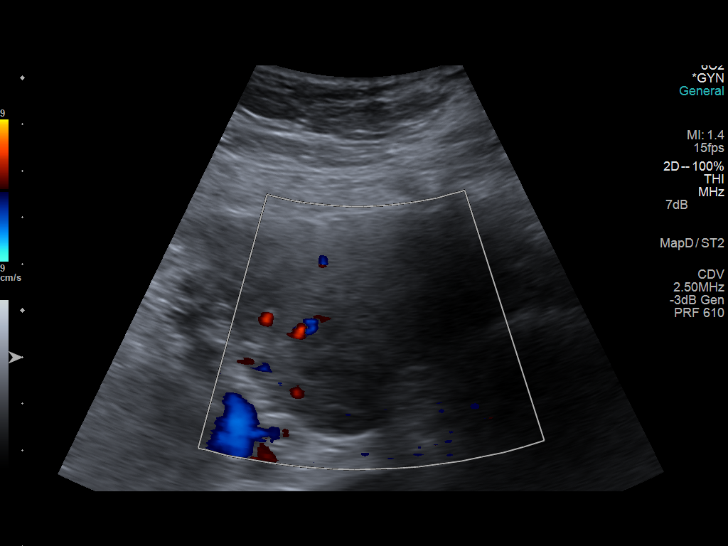
[im 47/141]
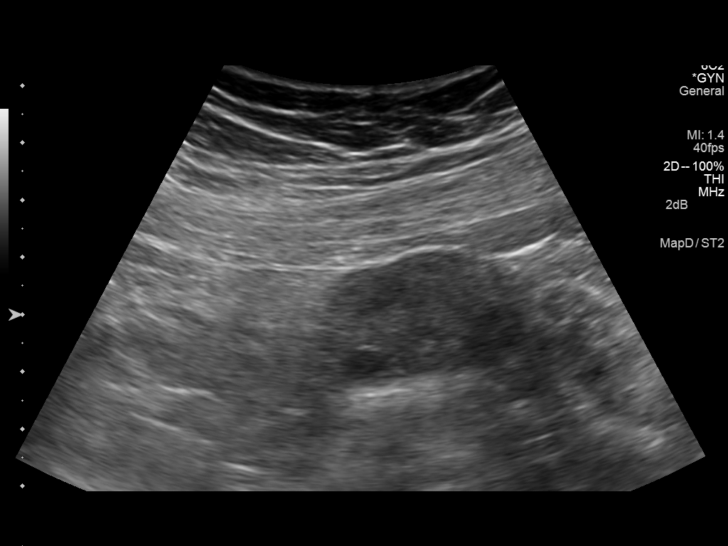
[im 59/141]
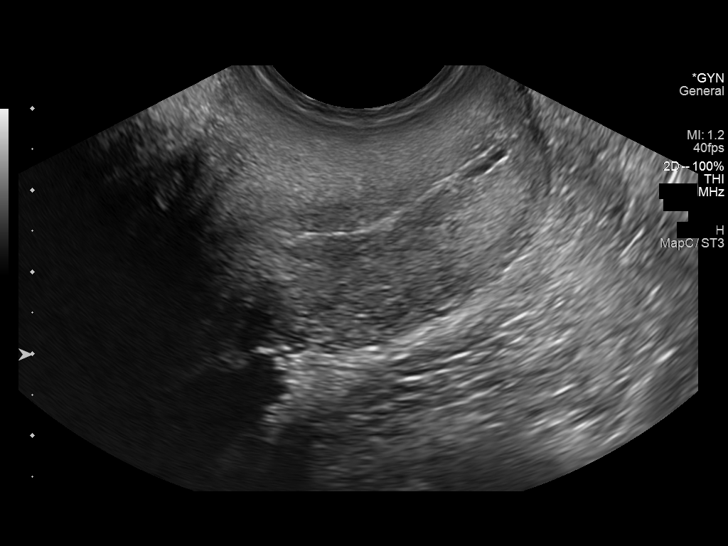
[im 71/141]
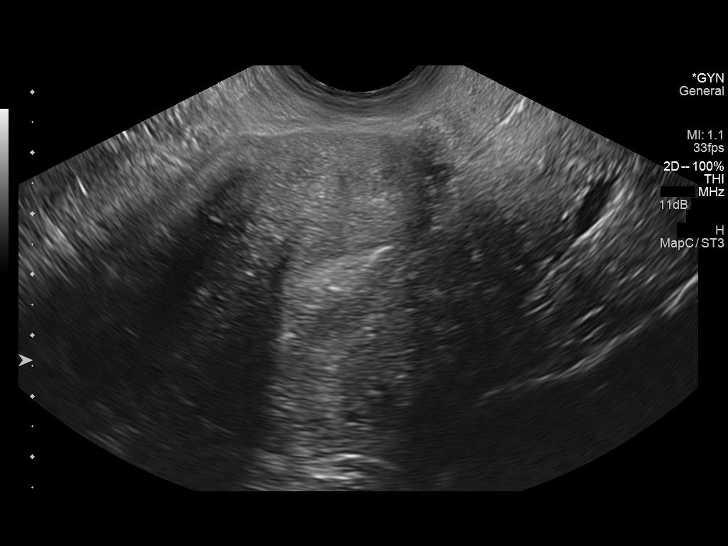
[im 82/141]
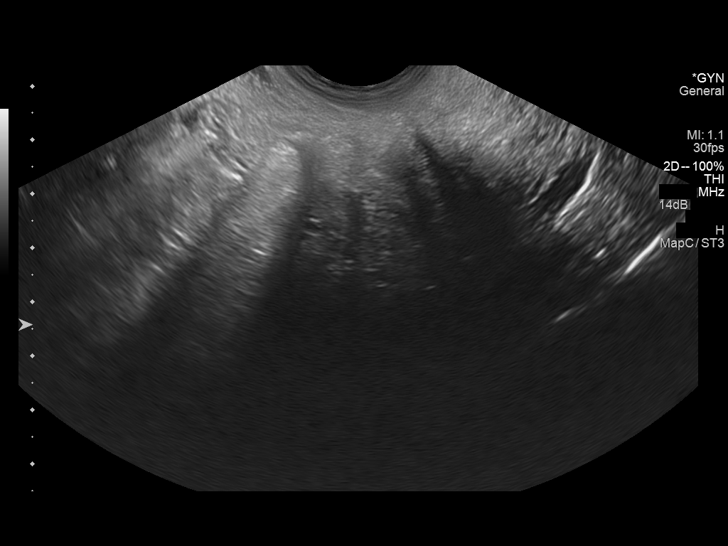
[im 94/141]
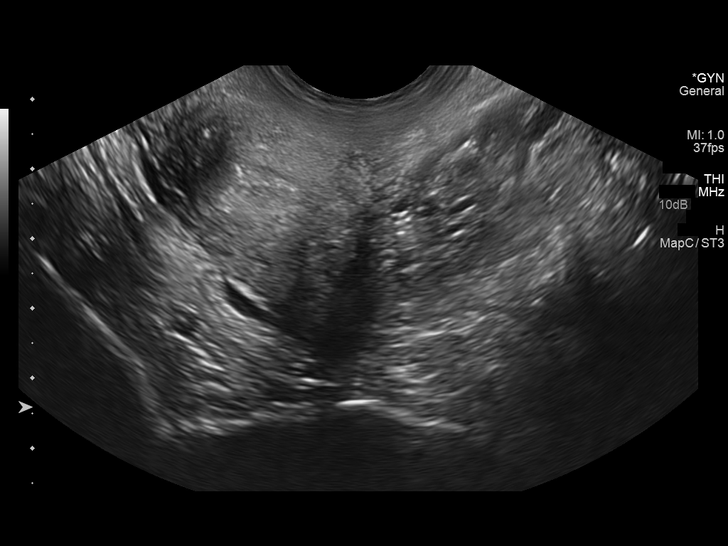
[im 106/141]
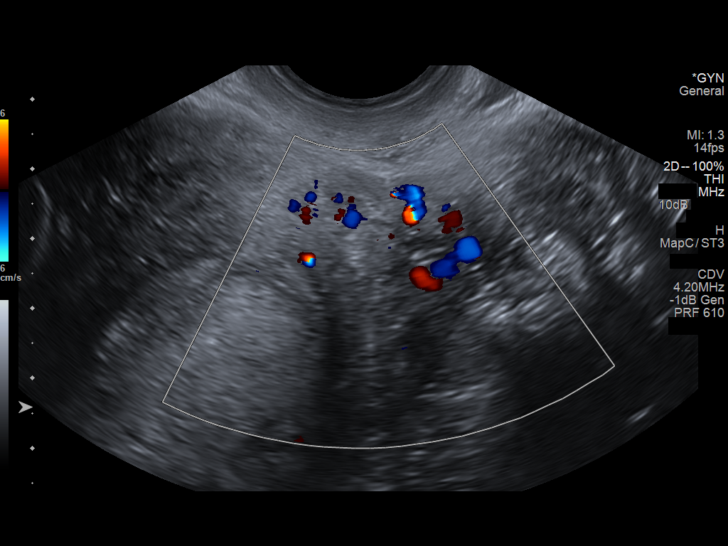
[im 117/141]
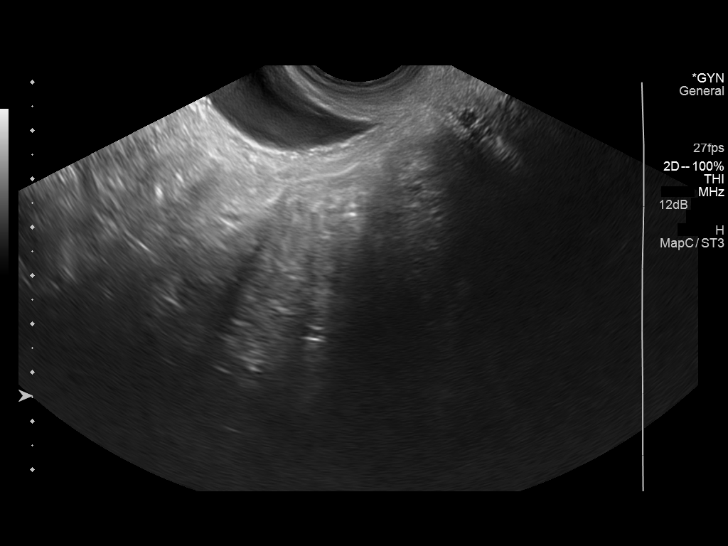
[im 129/141]
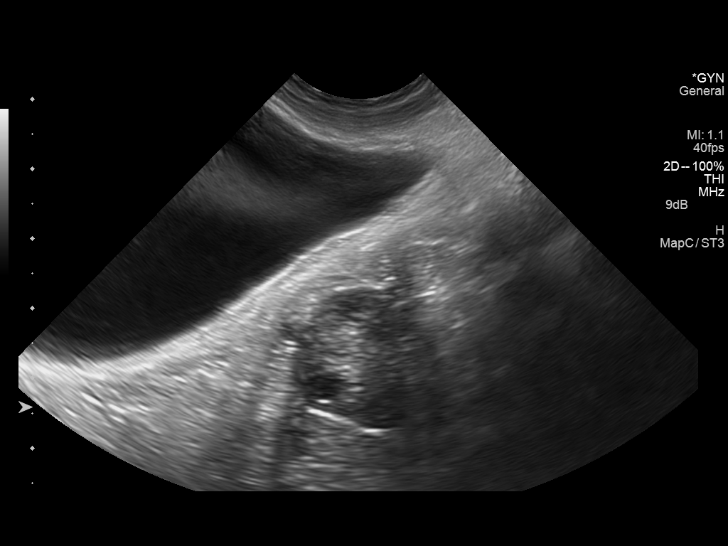
[im 141/141]
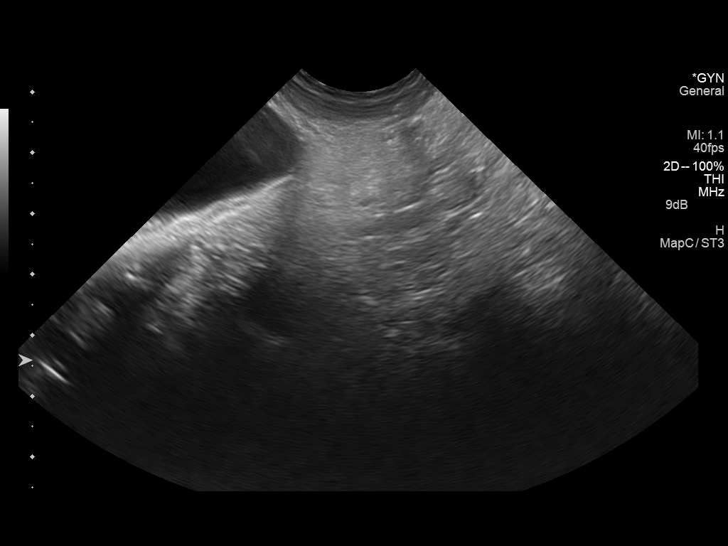

[13 of 25 positions shown; findings below may reference images not displayed]

FINDINGS: Uterus

Measurements: 9 x 4 x 7 cm. There are 2 heterogeneous intramural
masses, 2 cm in the anterior body and 2 cm in the left body. These
are consistent with fibroids.

Endometrium

Focal thickening of the upper endometrial cavity to 21 mm. This area
is hyperechoic relative to the thinner lower endometrial cavity
which measures 5 mm. There is suggestion of the feeding artery on
sagittal color Doppler imaging, increasing chances of polyp. The
appearance is not typical for a submucosal fibroid.

Right ovary

Measurements: 3.6 x 1.7 x 2.4 cm. Normal appearance/no adnexal mass.

Left ovary

Measurements: 2.9 x 2.1 x 2.1 cm. Normal appearance/no adnexal mass.

Other findings

Small volume, simple free pelvic fluid.
IMPRESSION: 1. Focal thickening of the upper endometrium to 21 mm, as discussed
above. Consider further evaluation with sonohysterogram for
confirmation prior to hysteroscopy. Endometrial sampling should also
be considered if patient is at high risk for endometrial carcinoma.
(Ref: Radiological Reasoning: Algorithmic Workup of Abnormal Vaginal
Bleeding with Endovaginal Sonography and Sonohysterography. AJR
[id number]; 191:S68-73)
2. Two 2 cm intramural uterine fibroids.

## 2015-07-07 ENCOUNTER — Ambulatory Visit: Payer: Self-pay | Admitting: Obstetrics and Gynecology

## 2015-09-11 ENCOUNTER — Other Ambulatory Visit: Payer: Self-pay | Admitting: Physician Assistant

## 2015-09-22 ENCOUNTER — Ambulatory Visit: Payer: Self-pay | Admitting: Physician Assistant

## 2017-04-26 ENCOUNTER — Other Ambulatory Visit (HOSPITAL_COMMUNITY): Payer: Self-pay | Admitting: *Deleted

## 2017-04-26 DIAGNOSIS — Z1231 Encounter for screening mammogram for malignant neoplasm of breast: Secondary | ICD-10-CM

## 2017-05-02 ENCOUNTER — Encounter: Payer: Self-pay | Admitting: *Deleted

## 2017-05-04 ENCOUNTER — Ambulatory Visit (HOSPITAL_COMMUNITY)
Admission: RE | Admit: 2017-05-04 | Discharge: 2017-05-04 | Disposition: A | Discharge status: Home / Self Care | Payer: PRIVATE HEALTH INSURANCE | Source: Ambulatory Visit | Attending: *Deleted | Admitting: *Deleted

## 2017-05-04 DIAGNOSIS — Z1231 Encounter for screening mammogram for malignant neoplasm of breast: Secondary | ICD-10-CM | POA: Insufficient documentation

## 2017-05-05 ENCOUNTER — Encounter: Payer: Self-pay | Admitting: Obstetrics and Gynecology

## 2017-05-05 ENCOUNTER — Ambulatory Visit (INDEPENDENT_AMBULATORY_CARE_PROVIDER_SITE_OTHER): Payer: Self-pay | Admitting: Obstetrics and Gynecology

## 2017-05-05 VITALS — BP 120/72 | HR 73 | Ht 66.0 in | Wt 234.0 lb

## 2017-05-05 DIAGNOSIS — N920 Excessive and frequent menstruation with regular cycle: Secondary | ICD-10-CM

## 2017-05-05 DIAGNOSIS — N852 Hypertrophy of uterus: Secondary | ICD-10-CM

## 2017-05-05 MED ORDER — NORGESTIMATE-ETH ESTRADIOL 0.25-35 MG-MCG PO TABS: 1.0000 | ORAL_TABLET | Freq: Every day | ORAL | 11 refills | Status: DC

## 2017-05-05 NOTE — Progress Notes (Signed)
   [facility] Visit  05/05/2017            Patient name: Jasmine Townsend MRN [id number]  Date of birth: [birth year]  CC & HPI:  [name] is a 41 y.o. female presenting today for heavy periods occurring for the past two years. Her periods last typically 5 days with very heavy flow. She had a blood test done two years ago in office. She was prescribed megestrol at that time. She had a PAP done 2 weeks ago. She denies any pain. She has take OCP before, which did not provide any relief. She states she had 2 periods a month with her OCP. She has used megace with relief.   Translator was used for Spanish  ROS:  ROS -pain -fever +menorrhagia  Pertinent History Reviewed:   Reviewed: Significant for C-Section, tubal ligation Medical         Past Medical History:  Diagnosis Date  . Anemia                               Surgical Hx:    Past Surgical History:  Procedure Laterality Date  . CESAREAN SECTION    . TUBAL LIGATION     Medications: Reviewed & Updated - see associated section                       Current Outpatient Prescriptions:  .  ferrous sulfate 325 (65 FE) MG tablet, Take 325 mg by mouth daily with breakfast., Disp: , Rfl:    Social History: Reviewed -  reports that she has never smoked. She has never used smokeless tobacco.  Objective Findings:  Vitals: Blood pressure 120/72, pulse 73, height 5\' 6"  (1.676 m), weight 234 lb (106.1 kg), last menstrual period 04/12/2017.  Physical Examination: General appearance - alert, well appearing, and in no distress Mental status - alert, oriented to person, place, and time Pelvic -  VULVA: normal appearing vulva with no masses, tenderness or lesions,  VAGINA: normal appearing vagina with normal color and discharge, no lesions,  CERVIX: normal appearing cervix without discharge or lesions,  UTERUS: deep in the pelvis, anteflexed, upper level  Normal To slightly enlarged ADNEXA: normal adnexa in size,  nontender and no masses   Assessment & Plan:   A:  1. Menorrhagia  2. Uterine size is upper limits normal size P:  1. Rx megace 35 pill, start when you begin your next period Follow-up in 6 weeks   By signing my name below, I, [name], attest that this documentation has been prepared under the direction and in the presence of Jonnie Kind, MD. Electronically Signed: Jabier Gauss, ED Scribe. 05/05/17. 10:13 AM.  I personally performed the services described in this documentation, which was SCRIBED in my presence. The recorded information has been reviewed and considered accurate. It has been edited as necessary during review. Jonnie Kind, MD

## 2017-06-15 ENCOUNTER — Ambulatory Visit (INDEPENDENT_AMBULATORY_CARE_PROVIDER_SITE_OTHER): Payer: Self-pay | Admitting: Obstetrics and Gynecology

## 2017-06-15 ENCOUNTER — Encounter: Payer: Self-pay | Admitting: Obstetrics and Gynecology

## 2017-06-15 VITALS — BP 130/90 | HR 78 | Wt 235.4 lb

## 2017-06-15 DIAGNOSIS — N920 Excessive and frequent menstruation with regular cycle: Secondary | ICD-10-CM

## 2017-06-15 NOTE — Progress Notes (Signed)
Patient ID: Jasmine Townsend, female   DOB: 1976/07/02, 41 y.o.   MRN: [id number]  [facility] Visit  06/15/17         Patient name: Jasmine Townsend MRN [id number]  Date of birth: [birth year]  CC & HPI:  Jasmine Townsend is a 41 y.o. female presenting today for follow up from sprintec use x 6 weeks. She reports that she had a menstrual cycles from the 7/13-7/28 and at the end of August 2018. Jasmine Townsend notes that she noted 2 days of clots with 6 days of bleeding following. Denies cramping and any other symptoms.   ROS:  ROS  +Heavy menstrual cycle with intermittent clotting. -Cramping All systems are negative except as noted in the HPI and PMH.    Pertinent History Reviewed:   Reviewed: Significant for anemia Medical         Past Medical History:  Diagnosis Date  . Anemia                               Surgical Hx:    Past Surgical History:  Procedure Laterality Date  . CESAREAN SECTION    . TUBAL LIGATION     Medications: Reviewed & Updated - see associated section                       Current Outpatient Prescriptions:  .  ferrous sulfate 325 (65 FE) MG tablet, Take 325 mg by mouth daily with breakfast., Disp: , Rfl:  .  norgestimate-ethinyl estradiol (ORTHO-CYCLEN,SPRINTEC,PREVIFEM) 0.25-35 MG-MCG tablet, Take 1 tablet by mouth daily., Disp: 1 Package, Rfl: 11   Social History: Reviewed -  reports that she has never smoked. She has never used smokeless tobacco.  Objective Findings:  Vitals: Blood pressure 130/90, pulse 78, weight 235 lb 6.4 oz (106.8 kg), last menstrual period 06/01/2017.  Physical Examination: discussion only   Discussion: 1. Discussed with Jasmine Townsend risks and benefits of continued sprintec use for heavy menstrual cycles   At end of discussion, Jasmine Townsend had opportunity to ask questions and has no further questions at this time.   Specific discussion of continued sprintec use for heavy menstrual cycles as noted above. Greater than 50% was spent in  counseling and coordination of care with the patient.   Total time greater than: 25 minutes.  The provider spent over 25 minutes with the visit , including previsit review, and documentation,with >than 50% spent in counseling and coordination of care. Translator service used.  Assessment & Plan:   A:  1. Menorrhagia, controlled with oral contraceptives  P:  1. Follow up in 3 months 2. Continue keeping record of menstrual cycles.     By signing my name below, I, Soijett Blue, attest that this documentation has been prepared under the direction and in the presence of Jonnie Kind, MD. Electronically Signed: Soijett Blue, ED Scribe. 06/15/17. 9:07 AM.  I personally performed the services described in this documentation, which was SCRIBED in my presence. The recorded information has been reviewed and considered accurate. It has been edited as necessary during review. Jonnie Kind, MD

## 2017-09-14 ENCOUNTER — Ambulatory Visit: Payer: Self-pay | Admitting: Obstetrics and Gynecology

## 2018-09-22 ENCOUNTER — Other Ambulatory Visit: Payer: Self-pay

## 2018-09-22 ENCOUNTER — Encounter (HOSPITAL_COMMUNITY): Payer: Self-pay | Admitting: Emergency Medicine

## 2018-09-22 ENCOUNTER — Observation Stay (HOSPITAL_COMMUNITY)
Admission: EM | Admit: 2018-09-22 | Discharge: 2018-09-23 | Disposition: A | Discharge status: Home / Self Care | Payer: Self-pay | Attending: Internal Medicine | Admitting: Internal Medicine

## 2018-09-22 ENCOUNTER — Emergency Department (HOSPITAL_COMMUNITY): Payer: Self-pay

## 2018-09-22 DIAGNOSIS — Z6835 Body mass index (BMI) 35.0-35.9, adult: Secondary | ICD-10-CM | POA: Insufficient documentation

## 2018-09-22 DIAGNOSIS — D649 Anemia, unspecified: Secondary | ICD-10-CM | POA: Diagnosis present

## 2018-09-22 DIAGNOSIS — N920 Excessive and frequent menstruation with regular cycle: Secondary | ICD-10-CM | POA: Insufficient documentation

## 2018-09-22 DIAGNOSIS — D500 Iron deficiency anemia secondary to blood loss (chronic): Principal | ICD-10-CM | POA: Insufficient documentation

## 2018-09-22 DIAGNOSIS — E6609 Other obesity due to excess calories: Secondary | ICD-10-CM

## 2018-09-22 DIAGNOSIS — Z79899 Other long term (current) drug therapy: Secondary | ICD-10-CM | POA: Insufficient documentation

## 2018-09-22 LAB — HEMOGLOBIN A1C
Hgb A1c MFr Bld: 4.9 % (ref 4.8–5.6)
MEAN PLASMA GLUCOSE: 93.93 mg/dL

## 2018-09-22 LAB — CBC WITH DIFFERENTIAL/PLATELET
Abs Immature Granulocytes: 0.02 10*3/uL (ref 0.00–0.07)
BASOS ABS: 0 10*3/uL (ref 0.0–0.1)
Basophils Relative: 1 %
EOS PCT: 2 %
Eosinophils Absolute: 0.1 10*3/uL (ref 0.0–0.5)
HCT: 25 % — ABNORMAL LOW (ref 36.0–46.0)
Hemoglobin: 6.7 g/dL — CL (ref 12.0–15.0)
IMMATURE GRANULOCYTES: 0 %
LYMPHS PCT: 23 %
Lymphs Abs: 1.4 10*3/uL (ref 0.7–4.0)
MCH: 14.9 pg — AB (ref 26.0–34.0)
MCHC: 26.8 g/dL — ABNORMAL LOW (ref 30.0–36.0)
MCV: 55.7 fL — ABNORMAL LOW (ref 80.0–100.0)
Monocytes Absolute: 0.6 10*3/uL (ref 0.1–1.0)
Monocytes Relative: 9 %
NEUTROS PCT: 65 %
NRBC: 0 % (ref 0.0–0.2)
Neutro Abs: 4 10*3/uL (ref 1.7–7.7)
PLATELETS: 383 10*3/uL (ref 150–400)
RBC: 4.49 MIL/uL (ref 3.87–5.11)
RDW: 23.3 % — ABNORMAL HIGH (ref 11.5–15.5)
WBC: 6.1 10*3/uL (ref 4.0–10.5)

## 2018-09-22 LAB — COMPREHENSIVE METABOLIC PANEL
ALT: 19 U/L (ref 0–44)
ANION GAP: 6 (ref 5–15)
AST: 22 U/L (ref 15–41)
Albumin: 3.9 g/dL (ref 3.5–5.0)
Alkaline Phosphatase: 82 U/L (ref 38–126)
BUN: 14 mg/dL (ref 6–20)
CO2: 21 mmol/L — AB (ref 22–32)
Calcium: 8.8 mg/dL — ABNORMAL LOW (ref 8.9–10.3)
Chloride: 110 mmol/L (ref 98–111)
Creatinine, Ser: 0.4 mg/dL — ABNORMAL LOW (ref 0.44–1.00)
GFR calc non Af Amer: >60 mL/min (ref >60)
Glucose, Bld: 98 mg/dL (ref 70–99)
Potassium: 3.6 mmol/L (ref 3.5–5.1)
SODIUM: 137 mmol/L (ref 135–145)
Total Bilirubin: 0.8 mg/dL (ref 0.3–1.2)
Total Protein: 7.7 g/dL (ref 6.5–8.1)

## 2018-09-22 LAB — HEMOGLOBIN AND HEMATOCRIT, BLOOD
HEMATOCRIT: 29.8 % — AB (ref 36.0–46.0)
HEMOGLOBIN: 8.6 g/dL — AB (ref 12.0–15.0)

## 2018-09-22 LAB — PREPARE RBC (CROSSMATCH)

## 2018-09-22 LAB — FERRITIN: FERRITIN: 2 ng/mL — AB (ref 11–307)

## 2018-09-22 LAB — URINALYSIS, ROUTINE W REFLEX MICROSCOPIC
Bilirubin Urine: NEGATIVE
GLUCOSE, UA: NEGATIVE mg/dL
HGB URINE DIPSTICK: NEGATIVE
Ketones, ur: NEGATIVE mg/dL
NITRITE: NEGATIVE
Protein, ur: NEGATIVE mg/dL
SPECIFIC GRAVITY, URINE: 1.011 (ref 1.005–1.030)
pH: 5 (ref 5.0–8.0)

## 2018-09-22 LAB — RETICULOCYTES
IMMATURE RETIC FRACT: 24.8 % — AB (ref 2.3–15.9)
RBC.: 4.27 MIL/uL (ref 3.87–5.11)
Retic Count, Absolute: 60.2 10*3/uL (ref 19.0–186.0)
Retic Ct Pct: 1.4 % (ref 0.4–3.1)

## 2018-09-22 LAB — VITAMIN B12: Vitamin B-12: 540 pg/mL (ref 180–914)

## 2018-09-22 LAB — IRON AND TIBC
IRON: 8 ug/dL — AB (ref 28–170)
Saturation Ratios: 1 % — ABNORMAL LOW (ref 10.4–31.8)
TIBC: 592 ug/dL — AB (ref 250–450)
UIBC: 584 ug/dL

## 2018-09-22 LAB — PREGNANCY, URINE: PREG TEST UR: NEGATIVE

## 2018-09-22 LAB — TSH: TSH: 2.483 u[IU]/mL (ref 0.350–4.500)

## 2018-09-22 LAB — FOLATE: FOLATE: 17.7 ng/mL (ref >5.9)

## 2018-09-22 MED ORDER — SODIUM CHLORIDE 0.9 % IV SOLN: 250.0000 mL | INTRAVENOUS | Status: DC | PRN

## 2018-09-22 MED ORDER — ONDANSETRON HCL 4 MG/2ML IJ SOLN: 4.0000 mg | Freq: Four times a day (QID) | INTRAMUSCULAR | Status: DC | PRN

## 2018-09-22 MED ORDER — SODIUM CHLORIDE 0.9% FLUSH: 3.0000 mL | INTRAVENOUS | Status: DC | PRN

## 2018-09-22 MED ORDER — SODIUM CHLORIDE 0.9 % IV SOLN
510.0000 mg | Freq: Once | INTRAVENOUS | Status: AC
  Administered 2018-09-22: 510 mg via INTRAVENOUS
  Filled 2018-09-22: qty 17

## 2018-09-22 MED ORDER — ONDANSETRON HCL 4 MG PO TABS: 4.0000 mg | ORAL_TABLET | Freq: Four times a day (QID) | ORAL | Status: DC | PRN

## 2018-09-22 MED ORDER — SODIUM CHLORIDE 0.9 % IV SOLN
10.0000 mL/h | Freq: Once | INTRAVENOUS | Status: AC
  Administered 2018-09-22: 10 mL/h via INTRAVENOUS

## 2018-09-22 MED ORDER — ACETAMINOPHEN 325 MG PO TABS
650.0000 mg | ORAL_TABLET | Freq: Four times a day (QID) | ORAL | Status: DC | PRN
  Administered 2018-09-22: 650 mg via ORAL
  Filled 2018-09-22: qty 2

## 2018-09-22 MED ORDER — ACETAMINOPHEN 650 MG RE SUPP: 650.0000 mg | Freq: Four times a day (QID) | RECTAL | Status: DC | PRN

## 2018-09-22 MED ORDER — FUROSEMIDE 10 MG/ML IJ SOLN
INTRAMUSCULAR | Status: AC
  Filled 2018-09-22: qty 2

## 2018-09-22 MED ORDER — FUROSEMIDE 10 MG/ML IJ SOLN
20.0000 mg | Freq: Once | INTRAMUSCULAR | Status: AC
  Administered 2018-09-22: 20 mg via INTRAVENOUS

## 2018-09-22 MED ORDER — SODIUM CHLORIDE 0.9% FLUSH
3.0000 mL | Freq: Two times a day (BID) | INTRAVENOUS | Status: DC
  Administered 2018-09-22 – 2018-09-23 (×2): 3 mL via INTRAVENOUS

## 2018-09-22 NOTE — ED Provider Notes (Signed)
[facility] EMERGENCY DEPARTMENT Provider Note   CSN: [id number] Arrival date & time: 09/22/18  5361     History   Chief Complaint Chief Complaint  Patient presents with  . Weakness    HPI Jasmine Townsend is a 42 y.o. female.  HPI   Jasmine Townsend is a 42 y.o. female who presents to the Emergency Department complaining of generalized weakness and dizziness with intermittent headache.  Symptoms have been present for months, but worsening for 3 to 4 weeks.  Dizziness is intermittent and associated with certain, sudden movements. She reports a history of anemia, she has been taking iron daily.  She states her hemoglobin level has never been above 8.  She was seen at the health department yesterday and contacted this morning and advised to come to the emergency department for possible blood transfusion and hemoglobin was 6.4.  States she required a blood transfusion approximately 18 years ago.  She does endorse heavy vaginal bleeding with her last menstrual cycle which was 09/05/2018.  She denies black or bloody stools, abdominal pain, fever, chills, visual changes and syncope.   Past Medical History:  Diagnosis Date  . Anemia     Patient Active Problem List   Diagnosis Date Noted  . Menometrorrhagia 12/12/2014  . Endometrial polyp 12/12/2014  . Menorrhagia with regular cycle 10/02/2014  . Metrorrhagia 09/30/2014  . Thickened endometrium upper 21 cm.  09/30/2014    Past Surgical History:  Procedure Laterality Date  . CESAREAN SECTION    . CHOLECYSTECTOMY    . TUBAL LIGATION       OB History    Gravida  4   Para  4   Term      Preterm      AB      Living  4     SAB      TAB      Ectopic      Multiple      Live Births               Home Medications    Prior to Admission medications   Medication Sig Start Date End Date Taking? Authorizing Provider  ferrous sulfate 325 (65 FE) MG tablet Take 325 mg by mouth 2 (two) times daily.    Yes [provider]  norgestimate-ethinyl estradiol (ORTHO-CYCLEN,SPRINTEC,PREVIFEM) 0.25-35 MG-MCG tablet Take 1 tablet by mouth daily. Patient not taking: Reported on 09/22/2018 05/05/17   Jonnie Kind, MD    Family History Family History  Problem Relation Age of Onset  . Hypertension Mother     Social History Social History   Tobacco Use  . Smoking status: Never Smoker  . Smokeless tobacco: Never Used  Substance Use Topics  . Alcohol use: No  . Drug use: No     Allergies   Patient has no known allergies.   Review of Systems Review of Systems  Constitutional: Positive for fatigue. Negative for activity change, appetite change, chills, diaphoresis and fever.  HENT: Negative for sore throat and trouble swallowing.   Eyes: Negative for photophobia, pain and visual disturbance.  Respiratory: Negative for chest tightness and shortness of breath.   Cardiovascular: Negative for chest pain and palpitations.  Gastrointestinal: Negative for abdominal pain, blood in stool, diarrhea, nausea and vomiting.  Genitourinary: Negative for dysuria and flank pain.  Musculoskeletal: Positive for myalgias. Negative for neck pain and neck stiffness.  Skin: Negative for color change, rash and wound.  Neurological: Positive for headaches. Negative  for dizziness, seizures, syncope, facial asymmetry, speech difficulty, weakness (Generalized weakness) and numbness.  Psychiatric/Behavioral: Negative for confusion and decreased concentration. The patient is not nervous/anxious.      Physical Exam Updated Vital Signs Temp 98.6 F (37 C) (Oral)   Wt 101.6 kg   LMP 09/05/2018   BMI 36.15 kg/m   Physical Exam Vitals signs and nursing note reviewed.  Constitutional:      Appearance: Normal appearance. She is not ill-appearing or toxic-appearing.  HENT:     Head: Normocephalic.     Mouth/Throat:     Mouth: Mucous membranes are moist.     Pharynx: Oropharynx is clear. No oropharyngeal exudate.   Eyes:     Extraocular Movements: Extraocular movements intact.     Conjunctiva/sclera: Conjunctivae normal.     Pupils: Pupils are equal, round, and reactive to light.  Neck:     Musculoskeletal: Normal range of motion. No neck rigidity or muscular tenderness.  Cardiovascular:     Rate and Rhythm: Normal rate and regular rhythm.     Pulses: Normal pulses.     Heart sounds: No murmur.  Pulmonary:     Effort: Pulmonary effort is normal. No respiratory distress.     Breath sounds: Normal breath sounds.  Abdominal:     General: There is no distension.     Palpations: Abdomen is soft.     Tenderness: There is no abdominal tenderness. There is no guarding.  Musculoskeletal: Normal range of motion.        General: No swelling.  Skin:    General: Skin is warm.     Capillary Refill: Capillary refill takes less than 2 seconds.     Findings: No bruising or rash.  Neurological:     General: No focal deficit present.     Mental Status: She is alert and oriented to person, place, and time.     Sensory: No sensory deficit.     Coordination: Coordination normal.     Comments: CN II-XII grossly intact.  No sensory or motor weakness.   Psychiatric:        Mood and Affect: Mood normal.      ED Treatments / Results  Labs (all labs ordered are listed, but only abnormal results are displayed) Labs Reviewed  CBC WITH DIFFERENTIAL/PLATELET - Abnormal; Notable for the following components:      Result Value   Hemoglobin 6.7 (*)    HCT 25.0 (*)    MCV 55.7 (*)    MCH 14.9 (*)    MCHC 26.8 (*)    RDW 23.3 (*)    All other components within normal limits  COMPREHENSIVE METABOLIC PANEL - Abnormal; Notable for the following components:   CO2 21 (*)    Creatinine, Ser 0.40 (*)    Calcium 8.8 (*)    All other components within normal limits  URINALYSIS, ROUTINE W REFLEX MICROSCOPIC  PREGNANCY, URINE  TYPE AND SCREEN  PREPARE RBC (CROSSMATCH)    EKG EKG  Interpretation  Date/Time:  Friday September 22 2018 10:09:57 EST Ventricular Rate:  72 PR Interval:    QRS Duration: 83 QT Interval:  390 QTC Calculation: 427 R Axis:   33 Text Interpretation:  Sinus rhythm Confirmed by Fredia Sorrow [id number]) on 09/22/2018 10:13:48 AM   Radiology Dg Chest 2 View  Result Date: 09/22/2018 CLINICAL DATA:  Weakness and dizziness EXAM: CHEST - 2 VIEW COMPARISON:  None. FINDINGS: Cardiac shadow is within normal limits. The lungs are well aerated  bilaterally. No focal infiltrate or sizable effusion is seen. No bony abnormality is noted. IMPRESSION: No acute abnormality seen. Electronically Signed   By: Inez Catalina M.D.   On: 09/22/2018 10:55    Procedures Procedures (including critical care time)  CRITICAL CARE Performed by: [place] Total critical care time: 30 minutes Critical care time was exclusive of separately billable procedures and treating other patients. Critical care was necessary to treat or prevent imminent or life-threatening deterioration. Critical care was time spent personally by me on the following activities: development of treatment plan with patient and/or surrogate as well as nursing, discussions with consultants, evaluation of patient's response to treatment, examination of patient, obtaining history from patient or surrogate, ordering and performing treatments and interventions, ordering and review of laboratory studies, ordering and review of radiographic studies, pulse oximetry and re-evaluation of patient's condition.   Medications Ordered in ED Medications - No data to display   Initial Impression / Assessment and Plan / ED Course  I have reviewed the triage vital signs and the nursing notes.  Pertinent labs & imaging results that were available during my care of the patient were reviewed by me and considered in my medical decision making (see chart for details).     Patient with symptomatic anemia.  Vitals  stable.  No chest pain or shortness of breath.  Sent here from the health department for possible transfusion.  Patient is stable appearing.  Will consult hospitalist for admission.  83  Consulted Dr. Dyann Kief, agrees to admit for transfusion  Final Clinical Impressions(s) / ED Diagnoses   Final diagnoses:  Symptomatic anemia    ED Discharge Orders    None       Kem Parkinson, PA-C 09/22/18 1209    Fredia Sorrow, MD 09/23/18 [phone]

## 2018-09-22 NOTE — ED Notes (Signed)
Advised patient we needed urine specimen. 

## 2018-09-22 NOTE — ED Notes (Signed)
Lab was able to draw anemia panel prior to blood infusion starting

## 2018-09-22 NOTE — Progress Notes (Signed)
MEDICATION RELATED CONSULT NOTE - INITIAL   Pharmacy Consult for IV iron Indication: symptomatic anemia/ iron deficiency   No Known Allergies  Patient Measurements: Height: 5\' 7"  (170.2 cm) Weight: 224 lb 10.4 oz (101.9 kg) IBW/kg (Calculated) : 61.6   Vital Signs: Temp: 98.7 F (37.1 C) (12/13 1653) Temp Source: Oral (12/13 1653) BP: 128/70 (12/13 1653) Pulse Rate: 68 (12/13 1653) Intake/Output from previous day: No intake/output data recorded. Intake/Output from this shift: Total I/O In: 1081.3 [Blood:1081.3] Out: -   Labs: Recent Labs    09/22/18 0955  WBC 6.1  HGB 6.7*  HCT 25.0*  PLT 383  CREATININE 0.40*  ALBUMIN 3.9  PROT 7.7  AST 22  ALT 19  ALKPHOS 82  BILITOT 0.8   Estimated Creatinine Clearance: 110.1 mL/min (A) (by C-G formula based on SCr of 0.4 mg/dL (L)).   Microbiology: No results found for this or any previous visit (from the past 720 hour(s)).  Medical History: Past Medical History:  Diagnosis Date  . Anemia     Medications:  Medications Prior to Admission  Medication Sig Dispense Refill Last Dose  . ferrous sulfate 325 (65 FE) MG tablet Take 325 mg by mouth 2 (two) times daily.    09/21/2018 at Unknown time  . norgestimate-ethinyl estradiol (ORTHO-CYCLEN,SPRINTEC,PREVIFEM) 0.25-35 MG-MCG tablet Take 1 tablet by mouth daily. (Patient not taking: Reported on 09/22/2018) 1 Package 11 Not Taking at Unknown time    Assessment: Pharmacy consulted for IV iron in patient with symptomatic anemia/ IV deficiency.  Patient's hemoglobin on admission is 6.7.  2 units of PRBCs have also been ordered.   Plan:  Feraheme 510 mg x 1 dose. Monitor hgb and patient improvement.  Revonda Standard Vassie Kugel 09/22/2018,5:35 PM

## 2018-09-22 NOTE — ED Notes (Signed)
CRITICAL VALUE ALERT  Critical Value:  Hemoglobin 6.7  Date & Time Notied:  09/22/2018, 1042  Provider Notified: Cyndi Bender PA  Orders Received/Actions taken: see chart

## 2018-09-22 NOTE — ED Triage Notes (Signed)
Pt [facility] to [facility] for weakness and dizzy.  The health dept stated that her Hgb was 7.8. pt reports still having weakness. Denies any blood in stool.

## 2018-09-22 NOTE — H&P (Signed)
History and Physical    Jasmine Townsend UUV:[id number] DOB: [birth year] DOA: 09/22/2018  Referring MD/NP/PA: Kem Parkinson, PA / Dr. Rogene Houston PCP: [facility]  Patient coming from: Home  Chief Complaint: Feeling weak, easy fatigability and intermittent lightheadedness.  HPI: Jasmine Townsend is a 42 y.o. female with past medical history significant for iron deficiency anemia, class II obesity, fibroma/menorrhagia; who presented to the emergency department as advised by her primary care provider due to low hemoglobin.  Patient reported seeing her PCP secondary to increased weakness, easy fatigability and intermittent episode of lightheadedness.  There was no chest pain, no shortness of breath, no nausea, no vomiting, no abdominal pain, no dysuria, no hematuria, no melena, no hematochezia, no focal weakness, no headache, no blurred vision or any other complaints. Patient expressed experiencing similar symptoms in the past when her hemoglobin was low and decided to have herself checked; blood work by PCP demonstrated low hemoglobin and she was instructed to come to the ED for blood transfusion.  In the ED hemoglobin was 6.7 otherwise unremarkable blood work.  Anemia panel demonstrated ferritin of 2 and an iron of 8; patient has been placed in observation for blood transfusion and IV iron infusion.  Past Medical/Surgical History: Past Medical History:  Diagnosis Date  . Anemia     Past Surgical History:  Procedure Laterality Date  . CESAREAN SECTION    . CHOLECYSTECTOMY    . TUBAL LIGATION      Social History:  reports that she has never smoked. She has never used smokeless tobacco. She reports that she does not drink alcohol or use drugs.  Allergies: No Known Allergies  Family History:  Family History  Problem Relation Age of Onset  . Hypertension Mother     Prior to Admission medications   Medication Sig Start Date End Date Taking? Authorizing Provider    ferrous sulfate 325 (65 FE) MG tablet Take 325 mg by mouth 2 (two) times daily.    Yes [provider]  norgestimate-ethinyl estradiol (ORTHO-CYCLEN,SPRINTEC,PREVIFEM) 0.25-35 MG-MCG tablet Take 1 tablet by mouth daily. Patient not taking: Reported on 09/22/2018 05/05/17   Jonnie Kind, MD    Review of Systems:  Negative except as otherwise mentioned in HPI.  Physical Exam: Vitals:   09/22/18 1316 09/22/18 1558 09/22/18 1636 09/22/18 1653  BP: (!) 126/56 121/70 121/65 128/70  Pulse: 74 68 71 68  Resp: 16 18 18 18   Temp: 98.6 F (37 C) 98.8 F (37.1 C) 98.9 F (37.2 C) 98.7 F (37.1 C)  TempSrc: Oral Oral Oral Oral  SpO2: 100% 100% 100% 100%  Weight:      Height:        Constitutional: Afebrile, calm, comfortable; denies chest pain and is in no acute distress. Eyes: PERRL, lids and conjunctivae normal, no icterus, no nystagmus. ENMT: Mucous membranes are moist. Posterior pharynx clear of any exudate or lesions. Normal dentition.  Neck: normal, supple, no masses, no thyromegaly, no JVD Respiratory: clear to auscultation bilaterally, no wheezing, no crackles. Normal respiratory effort. No accessory muscle use.  Cardiovascular: Regular rate and rhythm, no murmurs / rubs / gallops. No extremity edema. 2+ pedal pulses. No carotid bruits.  Abdomen: no tenderness, no masses palpated. No hepatosplenomegaly. Bowel sounds positive.  Musculoskeletal: no clubbing / cyanosis. No joint deformity upper and lower extremities. Good ROM, no contractures. Normal muscle tone.  Skin: no rashes, lesions, ulcers. No induration Neurologic: CN 2-12 grossly intact. Sensation intact, DTR normal. Strength 4/5 in  all 4 limbs due to poor effort.Marland Kitchen  Psychiatric: Normal judgment and insight. Alert and oriented x 3. Normal mood.    Labs on Admission: I have personally reviewed the following labs and imaging studies  CBC: Recent Labs  Lab 09/22/18 0955  WBC 6.1  NEUTROABS 4.0  HGB 6.7*  HCT  25.0*  MCV 55.7*  PLT 953   Basic Metabolic Panel: Recent Labs  Lab 09/22/18 0955  NA 137  K 3.6  CL 110  CO2 21*  GLUCOSE 98  BUN 14  CREATININE 0.40*  CALCIUM 8.8*   GFR: Estimated Creatinine Clearance: 110.1 mL/min (A) (by C-G formula based on SCr of 0.4 mg/dL (L)).   Liver Function Tests: Recent Labs  Lab 09/22/18 0955  AST 22  ALT 19  ALKPHOS 82  BILITOT 0.8  PROT 7.7  ALBUMIN 3.9   Thyroid Function Tests: Recent Labs    09/22/18 1213  TSH 2.483   Anemia Panel: Recent Labs    09/22/18 1213  VITAMINB12 540  FOLATE 17.7  FERRITIN 2*  TIBC 592*  IRON 8*  RETICCTPCT 1.4    Radiological Exams on Admission: Dg Chest 2 View  Result Date: 09/22/2018 CLINICAL DATA:  Weakness and dizziness EXAM: CHEST - 2 VIEW COMPARISON:  None. FINDINGS: Cardiac shadow is within normal limits. The lungs are well aerated bilaterally. No focal infiltrate or sizable effusion is seen. No bony abnormality is noted. IMPRESSION: No acute abnormality seen. Electronically Signed   By: Inez Catalina M.D.   On: 09/22/2018 10:55    EKG: Normal sinus rhythm, no acute ischemic changes.  Normal EKG.  Assessment/Plan 1-symptomatic anemia: Presented with lightheadedness and shortness of breath on exertion. -Anemia panel demonstrated iron deficiency anemia -After discussing with patient she expressed having trouble with iron deficient anemia for the last 10 years on and off. -Hemoglobin 6.7 on admission -2 units of PRBCs has been ordered -IV iron per pharmacy also requested. -Patient's cause of anemia most likely menorrhagia with history of fibroma -Will benefit of outpatient follow-up with GYN for hysterectomy. -Follow hemoglobin trend in a.m.  2-Menorrhagia with regular cycle -Outpatient follow-up with gynecology for hysterectomy evaluation -Patient last menstrual cycle 09/05/2018 -Unable to tolerate contraceptive pills.  3-Class 2 obesity due to excess calories with body mass  index (BMI) of 35.0 to 35.9 in adult -Low calorie diet, portion control and increase physical activity has been discussed with patient -Body mass index is 35.18 kg/m. -For stratification purposes will check TSH and A1c.  DVT prophylaxis: SCDs Code Status: Full code Family Communication: Daughter at bedside Disposition Plan: Anticipate discharge back home on 09/23/2018 after hemoglobin level stabilized. Consults called: None Admission status: observation, LOS <  2 midnights; MedSurg.   Time Spent: 60 minutes  Barton Dubois MD [facility] Pager [phone]  If 7PM-7AM, please contact night-coverage [email or web address] Password [email or web address]  09/22/2018, 5:04 PM

## 2018-09-23 DIAGNOSIS — N920 Excessive and frequent menstruation with regular cycle: Secondary | ICD-10-CM

## 2018-09-23 DIAGNOSIS — D500 Iron deficiency anemia secondary to blood loss (chronic): Secondary | ICD-10-CM

## 2018-09-23 DIAGNOSIS — D649 Anemia, unspecified: Secondary | ICD-10-CM

## 2018-09-23 DIAGNOSIS — Z6835 Body mass index (BMI) 35.0-35.9, adult: Secondary | ICD-10-CM

## 2018-09-23 DIAGNOSIS — E6609 Other obesity due to excess calories: Secondary | ICD-10-CM

## 2018-09-23 LAB — BPAM RBC
BLOOD PRODUCT EXPIRATION DATE: 201912282359
BLOOD PRODUCT EXPIRATION DATE: 201912302359
ISSUE DATE / TIME: 201912131200
ISSUE DATE / TIME: 201912131627
UNIT TYPE AND RH: 5100
Unit Type and Rh: 5100

## 2018-09-23 LAB — TYPE AND SCREEN
ABO/RH(D): O POS
Antibody Screen: NEGATIVE
UNIT DIVISION: 0
Unit division: 0

## 2018-09-23 LAB — BASIC METABOLIC PANEL
ANION GAP: 6 (ref 5–15)
BUN: 11 mg/dL (ref 6–20)
CO2: 22 mmol/L (ref 22–32)
Calcium: 8.8 mg/dL — ABNORMAL LOW (ref 8.9–10.3)
Chloride: 111 mmol/L (ref 98–111)
Creatinine, Ser: 0.38 mg/dL — ABNORMAL LOW (ref 0.44–1.00)
GFR calc Af Amer: >60 mL/min (ref >60)
GFR calc non Af Amer: >60 mL/min (ref >60)
Glucose, Bld: 96 mg/dL (ref 70–99)
Potassium: 3.5 mmol/L (ref 3.5–5.1)
Sodium: 139 mmol/L (ref 135–145)

## 2018-09-23 LAB — CBC
HCT: 30.6 % — ABNORMAL LOW (ref 36.0–46.0)
Hemoglobin: 8.8 g/dL — ABNORMAL LOW (ref 12.0–15.0)
MCH: 17.4 pg — ABNORMAL LOW (ref 26.0–34.0)
MCHC: 28.8 g/dL — AB (ref 30.0–36.0)
MCV: 60.6 fL — ABNORMAL LOW (ref 80.0–100.0)
NRBC: 0 % (ref 0.0–0.2)
Platelets: 356 10*3/uL (ref 150–400)
RBC: 5.05 MIL/uL (ref 3.87–5.11)
RDW: 29.2 % — AB (ref 11.5–15.5)
WBC: 5.2 10*3/uL (ref 4.0–10.5)

## 2018-09-23 LAB — HIV ANTIBODY (ROUTINE TESTING W REFLEX): HIV Screen 4th Generation wRfx: NONREACTIVE

## 2018-09-23 NOTE — Discharge Summary (Signed)
Physician Discharge Summary  Dalisa Forrer XHB:[id number] DOB: 17-Oct-1975 DOA: 09/22/2018  PCP: Sandria Manly [place] date: 81/10/7508 Discharge date: 09/23/2018  Admitted From: Home  Disposition:  Home   Recommendations for Outpatient Follow-up and new medication changes:  1. Follow up with [facility] in 2 weeks 2. Please obtain Iron panel in 2 weeks 3. Patient had Iron infusion and 2 units PRBC transfusion 4. Needs follow up as outpatient with GYN.   Home Health: no   Equipment/Devices: no    Discharge Condition: stable  CODE STATUS: full  Diet recommendation:  Regular.   Brief/Interim Summary: 42 year old female who presented with generalized weakness, easy fatigability and lightheadedness.  She does have significant past medical history for iron deficiency anemia, obesity, and menorrhagia/fibroids.  Patient was seen by her primary care provider who found her to be anemic.  On her initial physical examination blood pressure 126/56, heart rate 74, respiratory rate 16, temperature 98.6,   She had moist mucous membranes, lungs clear to auscultation bilaterally, abdomen was soft nontender, no lower extremity edema.  Sodium 137, potassium 3.6, chloride 110, bicarb 21, glucose 98, BUN 14, creatinine 0.40, white count 6.1, hemoglobin 6.7, Hct 25.0, platelets 383.  Urinalysis negative for infection.  Chest radiograph negative for infiltrates.  EKG normal sinus rhythm, normal axis, normal intervals.  Patient was admitted to the hospital with a working diagnosis of a symptomatic anemia due to menorrhagia.  1. Symptomatic iron deficiency due to menorrhagia.  Patient was admitted to the medical ward, she received 2 units packed red blood cells, with good toleration.  Her symptoms have improved, patient will be discharged home, follow-up as an outpatient in 2 weeks.  Her iron panel shows significant deficiency with a ferritin of 2, transferrin saturation of  1, TIBC 592 and iron 8.  Patient received 1 dose of IV ferumoxytol with good toleration.  2.  Menorrhagia.  Patient will need follow-up as an outpatient with GYN.  3.  Obesity class II.  Will need outpatient follow-up.  Discharge Diagnoses:  Principal Problem:   Symptomatic anemia Active Problems:   Menorrhagia with regular cycle   Class 2 obesity due to excess calories with body mass index (BMI) of 35.0 to 35.9 in adult    Discharge Instructions   Allergies as of 09/23/2018   No Known Allergies     Medication List    STOP taking these medications   norgestimate-ethinyl estradiol 0.25-35 MG-MCG tablet Commonly known as:  ORTHO-CYCLEN,SPRINTEC,PREVIFEM     TAKE these medications   ferrous sulfate 325 (65 FE) MG tablet Take 325 mg by mouth 2 (two) times daily.       No Known Allergies  Consultations:     Procedures/Studies: Dg Chest 2 View  Result Date: 09/22/2018 CLINICAL DATA:  Weakness and dizziness EXAM: CHEST - 2 VIEW COMPARISON:  None. FINDINGS: Cardiac shadow is within normal limits. The lungs are well aerated bilaterally. No focal infiltrate or sizable effusion is seen. No bony abnormality is noted. IMPRESSION: No acute abnormality seen. Electronically Signed   By: Inez Catalina M.D.   On: 09/22/2018 10:55       Subjective: Patient is feeling better, no nausea or vomiting, no chest pain or dyspnea. No menorrhagia, no hematemesis, or melena.   Discharge Exam: Vitals:   09/23/18 0634 09/23/18 0948  BP: (!) 113/58 (!) 120/56  Pulse: 66 74  Resp: 18   Temp: 98.4 F (36.9 C) 98.4 F (36.9 C)  SpO2:  100% 100%   Vitals:   09/22/18 2118 09/22/18 2137 09/23/18 0634 09/23/18 0948  BP: 128/65  (!) 113/58 (!) 120/56  Pulse: 73  66 74  Resp: 18  18   Temp: 98.2 F (36.8 C)  98.4 F (36.9 C) 98.4 F (36.9 C)  TempSrc: Oral  Oral Oral  SpO2: 100% 100% 100% 100%  Weight:      Height:        General: Not in pain or dyspnea.  Neurology: Awake and  alert, non focal  E ENT: mild pallor, no icterus, oral mucosa moist Cardiovascular: No JVD. S1-S2 present, rhythmic, no gallops, rubs, or murmurs. No lower extremity edema. Pulmonary: vesicular breath sounds bilaterally, adequate air movement, no wheezing, rhonchi or rales. Gastrointestinal. Abdomen with no organomegaly, non tender, no rebound or guarding Skin. No rashes Musculoskeletal: no joint deformities   The results of significant diagnostics from this hospitalization (including imaging, microbiology, ancillary and laboratory) are listed below for reference.     Microbiology: No results found for this or any previous visit (from the past 240 hour(s)).   Labs: BNP (last 3 results) No results for input(s): BNP in the last 8760 hours. Basic Metabolic Panel: Recent Labs  Lab 09/22/18 0955 09/23/18 0709  NA 137 139  K 3.6 3.5  CL 110 111  CO2 21* 22  GLUCOSE 98 96  BUN 14 11  CREATININE 0.40* 0.38*  CALCIUM 8.8* 8.8*   Liver Function Tests: Recent Labs  Lab 09/22/18 0955  AST 22  ALT 19  ALKPHOS 82  BILITOT 0.8  PROT 7.7  ALBUMIN 3.9   No results for input(s): LIPASE, AMYLASE in the last 168 hours. No results for input(s): AMMONIA in the last 168 hours. CBC: Recent Labs  Lab 09/22/18 0955 09/22/18 2157 09/23/18 0709  WBC 6.1  --  5.2  NEUTROABS 4.0  --   --   HGB 6.7* 8.6* 8.8*  HCT 25.0* 29.8* 30.6*  MCV 55.7*  --  60.6*  PLT 383  --  356   Cardiac Enzymes: No results for input(s): CKTOTAL, CKMB, CKMBINDEX, TROPONINI in the last 168 hours. BNP: Invalid input(s): POCBNP CBG: No results for input(s): GLUCAP in the last 168 hours. D-Dimer No results for input(s): DDIMER in the last 72 hours. Hgb A1c Recent Labs    09/22/18 1213  HGBA1C 4.9   Lipid Profile No results for input(s): CHOL, HDL, LDLCALC, TRIG, CHOLHDL, LDLDIRECT in the last 72 hours. Thyroid function studies Recent Labs    09/22/18 1213  TSH 2.483   Anemia work up Recent Labs     09/22/18 1213  VITAMINB12 540  FOLATE 17.7  FERRITIN 2*  TIBC 592*  IRON 8*  RETICCTPCT 1.4   Urinalysis    Component Value Date/Time   COLORURINE YELLOW 09/22/2018 1603   APPEARANCEUR HAZY (A) 09/22/2018 1603   LABSPEC 1.011 09/22/2018 1603   PHURINE 5.0 09/22/2018 1603   GLUCOSEU NEGATIVE 09/22/2018 1603   HGBUR NEGATIVE 09/22/2018 1603   BILIRUBINUR NEGATIVE 09/22/2018 1603   KETONESUR NEGATIVE 09/22/2018 1603   PROTEINUR NEGATIVE 09/22/2018 1603   NITRITE NEGATIVE 09/22/2018 1603   LEUKOCYTESUR TRACE (A) 09/22/2018 1603   Sepsis Labs Invalid input(s): PROCALCITONIN,  WBC,  LACTICIDVEN Microbiology No results found for this or any previous visit (from the past 240 hour(s)).   Time coordinating discharge: 45 minutes  SIGNED:   Tawni Millers, MD  [facility] 09/23/2018, 12:18 PM Pager [phone]  If 7PM-7AM, please contact night-coverage  [email or web address] Password [email or web address]

## 2018-09-23 NOTE — Plan of Care (Signed)
MD at bedside. Patient adequate for discharge.

## 2018-09-23 NOTE — Plan of Care (Signed)
Pt discharged. She is alert and oriented x 4 and her vitals are stable. Reviewed all discharge paper work and instructions with patient and her daughter. Patient and daughter verbalized understanding of discharge paper work and instruction. Both ivs were removed. Patient was taken downstairs via wheelchair by nursing staff.

## 2018-09-23 NOTE — Plan of Care (Signed)
Patient denies pain. Patient denies bleeding. She states she is feeling better and awaiting to see the doctor.

## 2018-11-01 ENCOUNTER — Encounter: Payer: Self-pay | Admitting: *Deleted

## 2019-01-03 ENCOUNTER — Encounter: Payer: Self-pay | Admitting: Obstetrics and Gynecology

## 2019-01-03 ENCOUNTER — Other Ambulatory Visit: Payer: Self-pay

## 2019-01-03 ENCOUNTER — Ambulatory Visit (INDEPENDENT_AMBULATORY_CARE_PROVIDER_SITE_OTHER): Payer: Self-pay | Admitting: Obstetrics and Gynecology

## 2019-01-03 ENCOUNTER — Telehealth: Payer: Self-pay | Admitting: *Deleted

## 2019-01-03 DIAGNOSIS — N921 Excessive and frequent menstruation with irregular cycle: Secondary | ICD-10-CM

## 2019-01-03 MED ORDER — MEGESTROL ACETATE 40 MG PO TABS: 40.0000 mg | ORAL_TABLET | Freq: Two times a day (BID) | ORAL | 1 refills | Status: DC

## 2019-01-03 NOTE — Progress Notes (Signed)
TELEHEALTH VIRTUAL GYNECOLOGY VISIT ENCOUNTER NOTE  I connected with Jasmine Townsend on 01/03/19 at  2:00 PM EDT by telephone at home and verified that I am speaking with the correct person using two identifiers.   I discussed the limitations, risks, security and privacy concerns of performing an evaluation and management service by telephone and the availability of in person appointments. I also discussed with the patient that there may be a patient responsible charge related to this service. The patient expressed understanding and agreed to proceed.   History:  Jasmine Townsend is a 43 y.o. G34P4 female being evaluated today for heavy menstrual bleeding.  The patient was seen in the emergency room March 17 and received 2 units packed cells due to hemoglobin of 6.7 that was symptomatic. She denies any abnormal vaginal discharge, bleeding, pelvic pain or other concerns.    She has had no bleeding since she left the hospital.  She is taking iron tablets twice daily.  She denies constipation  Records are reviewed she has not had an ultrasound of the pelvis and this will be scheduled prior to follow-up visit Past Medical History:  Diagnosis Date  . Anemia    Past Surgical History:  Procedure Laterality Date  . CESAREAN SECTION    . CHOLECYSTECTOMY    . TUBAL LIGATION     The following portions of the patient's history were reviewed and updated as appropriate: allergies, current medications, past family history, past medical history, past social history, past surgical history and problem list.   Health Maintenance: Followed for health maintenance at the health department in [place]:  Pertinent items noted in HPI and remainder of comprehensive ROS otherwise negative.  Physical Exam:  Physical exam deferred due to nature of the encounter  Labs and Imaging No results found for this or any previous visit (from the past 336 hour(s)). No results found.    Assessment  and Plan:     2.  Menorrhagia with anemia We have ordered a CBC for her to obtain at this time to see how the recovery has been since she received the 2 units packed cells and IV iron - CBC ordered 2 we will add Megace 40 mg twice daily 3.  We will see patient back in 6 weeks and schedule pelvic ultrasound at the hospital prior to visit here in 6 weeks       I discussed the assessment and treatment plan with the patient. The patient was provided an opportunity to ask questions and all were answered. The patient agreed with the plan and demonstrated an understanding of the instructions.   The patient was advised to call back or seek an in-person evaluation/go to the ED if the symptoms worsen or if the condition fails to improve as anticipated.  I provided 15 minutes of non-face-to-face time during this encounter, and its documentation.  Translator service used for this encounter    Jonnie Kind, [place] for [employer], [place] Telephone Codes: . [phone]- Telephone evaluation and management service by a physician or other qualified health care professional who may report evaluation and management services provided to an established patient, parent, or guardian not originating from a related E/M service provided within the previous 7 days nor leading to an E/M service or procedure within the next 24 hours or soonest available appointment; 5-10 minutes of medical discussion  . [id number] - 11-20 minutes of medical discussion  . [id number] - 21-30 minutes  of medical discussion  . Note: Medicare and Medicaid only G2012- Brief communication technology-based service, e.g., virtual check-in, by a physician or other qualified health care professional who can report evaluation and management services, provided to an established patient, not originating from a related E/M service provided within the previous 7 days nor leading to an E/M service or procedure within the next 24 hours or  soonest available appointment; 5-10 minutes WebEx Codes:   . [phone]  . [phone]  Behavioral Medicine: . [phone]

## 2019-01-03 NOTE — Telephone Encounter (Signed)
Erroneous encounter

## 2019-01-03 NOTE — Progress Notes (Signed)
Patient ID: Jasmine Townsend, female   DOB: 09-15-1976, 43 y.o.   MRN: [id number]  TELEPHONE VISIT W/ DISCUSSION

## 2019-02-19 ENCOUNTER — Telehealth: Payer: Self-pay | Admitting: *Deleted

## 2019-02-19 NOTE — Telephone Encounter (Signed)
Spoke with pt letting her know no visitors or children at tomorrow's appt. Also, pt hasn't come in contact with anyone in the last month that has been confirmed or suspected of having Covid-19 nor is she experiencing any symptoms herself. Pt will bring her own mask. Pt plans on coming to appt. Pt seemed to understand me. [place]

## 2019-02-20 ENCOUNTER — Telehealth: Payer: Self-pay | Admitting: Obstetrics and Gynecology

## 2019-02-20 ENCOUNTER — Ambulatory Visit: Payer: Self-pay | Admitting: Obstetrics and Gynecology

## 2019-02-20 ENCOUNTER — Other Ambulatory Visit: Payer: Self-pay

## 2019-02-20 NOTE — Telephone Encounter (Signed)
Patient called stating that she was placed on a medication to stop her period but she states that it has not worked. Pt states that her period was heavy and then it was light, now pt states that she is experiencing slight stomach pain and is now heavy on her period again. Pt would like to know if she should stop taking the medication or not. Please contact pt

## 2019-03-02 ENCOUNTER — Telehealth: Payer: Self-pay | Admitting: Obstetrics and Gynecology

## 2019-03-02 NOTE — Telephone Encounter (Signed)
Pt is calling to find out why she was never called to schedule a [place]. Has been waiting for call. Advised pt it would be next week after tues before she gets a call

## 2019-03-06 ENCOUNTER — Other Ambulatory Visit: Payer: Self-pay | Admitting: *Deleted

## 2019-03-06 DIAGNOSIS — N921 Excessive and frequent menstruation with irregular cycle: Secondary | ICD-10-CM

## 2019-03-13 ENCOUNTER — Other Ambulatory Visit: Payer: Self-pay

## 2019-03-13 ENCOUNTER — Ambulatory Visit (HOSPITAL_COMMUNITY)
Admission: RE | Admit: 2019-03-13 | Discharge: 2019-03-13 | Disposition: A | Discharge status: Home / Self Care | Payer: Self-pay | Source: Ambulatory Visit | Attending: Obstetrics and Gynecology | Admitting: Obstetrics and Gynecology

## 2019-03-13 DIAGNOSIS — N921 Excessive and frequent menstruation with irregular cycle: Secondary | ICD-10-CM | POA: Insufficient documentation

## 2019-03-20 NOTE — Telephone Encounter (Signed)
Jasmine Townsend has now had an u/s that shows a slightly enlarged uterus. She should still be a good Mirena/ Liletta IUD candidate.  I cannot conduct a good conversation with her without face to face, and a Translator, given the language barrier.  Please ask [name] to assist with making an appointment for Jasmine Townsend. We could perhaps send some info in spanish about IUD's for her to read if she's willing to consider them.

## 2019-03-27 ENCOUNTER — Telehealth: Payer: Self-pay | Admitting: Obstetrics and Gynecology

## 2019-03-27 NOTE — Telephone Encounter (Signed)
We have you scheduled for an upcoming appointment at our office. At this time, we are still not allowing any visitors or children to come in with you during your appointment time unless you need physical assistance. We understand this may be different from your past appointments and know this may be difficult but our goal is to keep everyone safe.   We ask if you have had any exposure to anyone suspected or confirmed of having COVID-19 or if you are experiencing any of the following, to call and reschedule your appointment: fever, cough, shortness of breath, muscle pain, diarrhea, rash, vomiting, abdominal pain, red eye, weakness, bruising, bleeding, joint pain, or a severe headache.   Also,to keep you safe, please use the provided hand sanitizer when you enter the office. We are asking everyone in the office to wear a mask to help prevent the spread of germs. If you have a mask of your own, please wear it to your appointment, if not, we are happy to provide one for you.  We ask that you complete your E-check-in via mychart prior to your arrival and check-in via Hello Patient and call our office when you arrive in our office parking lot to complete your registration over the phone.  This is to help speed up the check-in process and reduce patient volume in our office.    Please know we will ask you these questions or similar questions when you arrive for your appointment and again its how we are keeping everyone safe.  Thank you for understanding and your cooperation.

## 2019-03-28 ENCOUNTER — Other Ambulatory Visit: Payer: Self-pay

## 2019-03-28 ENCOUNTER — Ambulatory Visit (INDEPENDENT_AMBULATORY_CARE_PROVIDER_SITE_OTHER): Payer: Self-pay | Admitting: Obstetrics and Gynecology

## 2019-03-28 ENCOUNTER — Encounter: Payer: Self-pay | Admitting: Obstetrics and Gynecology

## 2019-03-28 VITALS — BP 129/86 | HR 79 | Ht 66.0 in | Wt 234.8 lb

## 2019-03-28 DIAGNOSIS — N920 Excessive and frequent menstruation with regular cycle: Secondary | ICD-10-CM

## 2019-03-28 NOTE — Progress Notes (Signed)
Patient ID: Jasmine Townsend, female   DOB: 27-Jun-1976, 43 y.o.   MRN: [id number]    [facility] Visit  @DATE @            Patient name: Jasmine Townsend MRN [id number]  Date of birth: Feb 15, 1976  CC & HPI:  Jasmine Townsend is a 43 y.o. female presenting today for heavy menses and anemia. Stopped taking megace. Around March 7, lasting 6-7 days, 2 or 3 days with clots. She doesn't use tampon, and is unsure how many pads she uses during her period. Medication was working but around April 20 she have having intermittent bleeding on and off until time of u/s appt. while on the Megace, which she is now discontinued has never had back pain and cramps, but symptoms started when taking megace, once she ran out she stopped having cramps and back pain. She is taking two iron tablets a day to keep blood levels up.  Had u/s done 03/13/2019 with impression of two small uterine leiomyomata's, and an unremarkable sonohysterogram.  There is no photodocumentation of the sonohysterogram component to the ultrasound so I cannot review them myself.  The sonohysterogram is reportedly normal with no evidence of polyps as had been previously suggested on prior ultrasound She has not had an endometrial biopsy that I can identify in the records.  The ultrasound does show the old C-section line and the fibroids are intramural  ROS:  ROS +menorrhagia +submucosal fibroids -cramps -Lower back pain -fever History of 2 prior cesarean sections, after 2 vaginal deliveries All systems are negative except as noted in the HPI and PMH.    Pertinent History Reviewed:   Reviewed: Medical         Past Medical History:  Diagnosis Date  . Anemia                               Surgical Hx:    Past Surgical History:  Procedure Laterality Date  . CESAREAN SECTION    . CHOLECYSTECTOMY    . TUBAL LIGATION     Medications: Reviewed & Updated - see associated section                       Current Outpatient Medications:  .   ferrous sulfate 325 (65 FE) MG tablet, Take 325 mg by mouth 2 (two) times daily. , Disp: , Rfl:  .  megestrol (MEGACE) 40 MG tablet, Take 1 tablet (40 mg total) by mouth 2 (two) times daily. (Patient not taking: Reported on 03/28/2019), Disp: 60 tablet, Rfl: 1   Social History: Reviewed -  reports that she has never smoked. She has never used smokeless tobacco.  Objective Findings:  Vitals: Blood pressure 129/86, pulse 79, height 5\' 6"  (1.676 m), weight 234 lb 12.8 oz (106.5 kg), last menstrual period 01/29/2019.  PHYSICAL EXAMINATION General appearance - alert, well appearing, and in no distress Mental status - alert, oriented to person, place, and time, normal mood, behavior, speech, dress, motor activity, and thought processes, affect appropriate to mood   PELVIC Discussion of pelvic u/s only   Assessment & Plan:   A:  1. Submucosal fibroids 2. Menorrhagia 3. Chronic anemeia, worsened by megace  P:  1.  Canindate for IUD, will pursue pricing at [facility]  2. 1 week phone call   By signing my name below, I, Samul Dada, attest that this documentation has been prepared under  the direction and in the presence of Jonnie Kind, MD. Electronically Signed: [place]. 03/28/19. 9:21 AM.  I personally performed the services described in this documentation, which was SCRIBED in my presence. The recorded information has been reviewed and considered accurate. It has been edited as necessary during review. Jonnie Kind, MD

## 2019-03-28 NOTE — Patient Instructions (Signed)

## 2019-11-24 IMAGING — US US PELVIS COMPLETE WITH TRANSVAGINAL
1 series · 13 of 25 positions shown · non-contrast
Comparison: None

CLINICAL DATA: Menorrhagia with irregular cycle, anemia

EXAM:
TRANSABDOMINAL AND TRANSVAGINAL ULTRASOUND OF PELVIS
TECHNIQUE: Both transabdominal and transvaginal ultrasound examinations of the
pelvis were performed. Transabdominal technique was performed for
global imaging of the pelvis including uterus, ovaries, adnexal
regions, and pelvic cul-de-sac. It was necessary to proceed with
endovaginal exam following the transabdominal exam to visualize the
endometrium and ovaries.

[Series 1: us pelvis complete with transvaginal · 0.24mm/px · 13 of 165 slices shown]
[im 1/165]
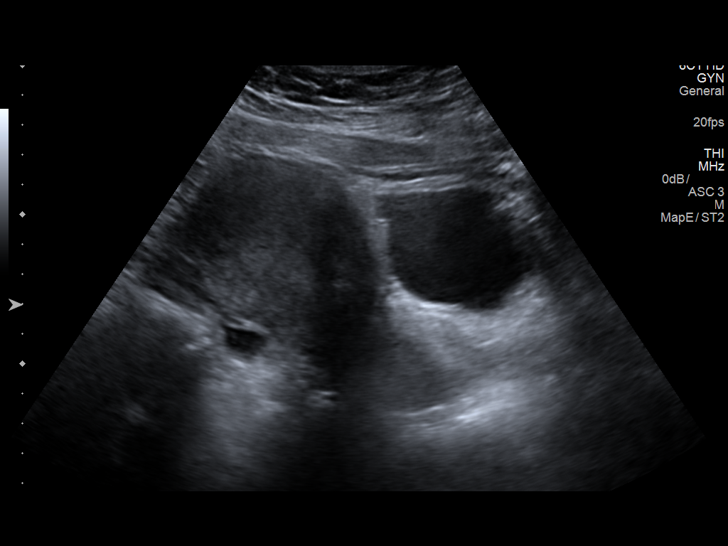
[im 14/165]
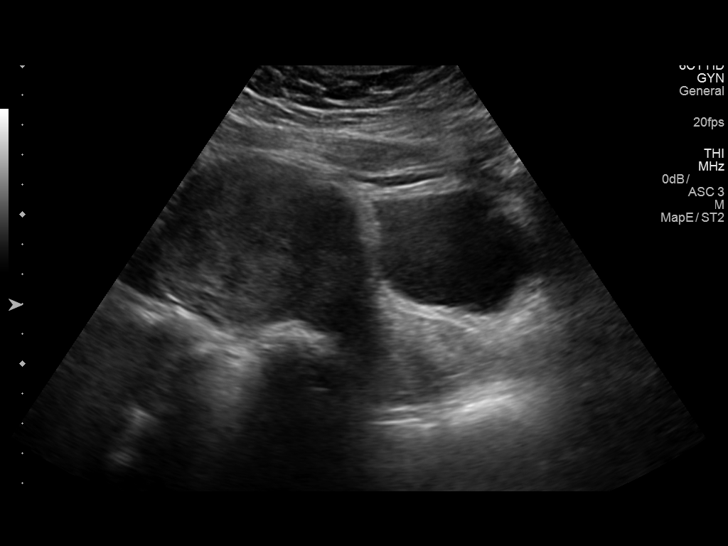
[im 28/165]
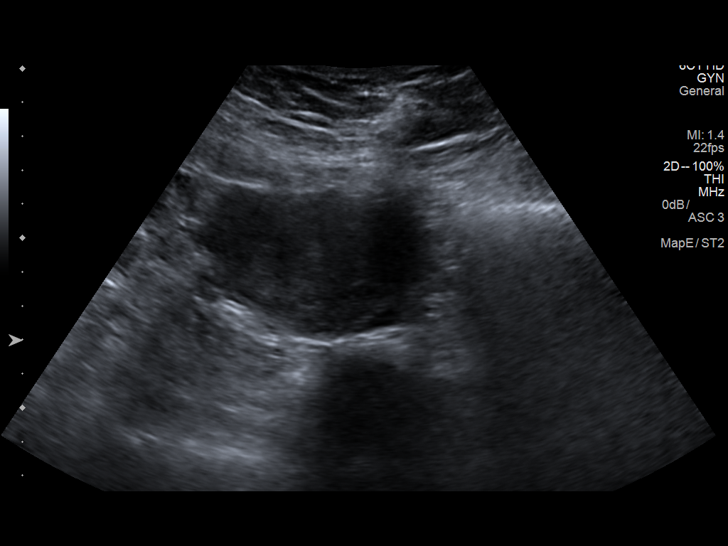
[im 42/165]
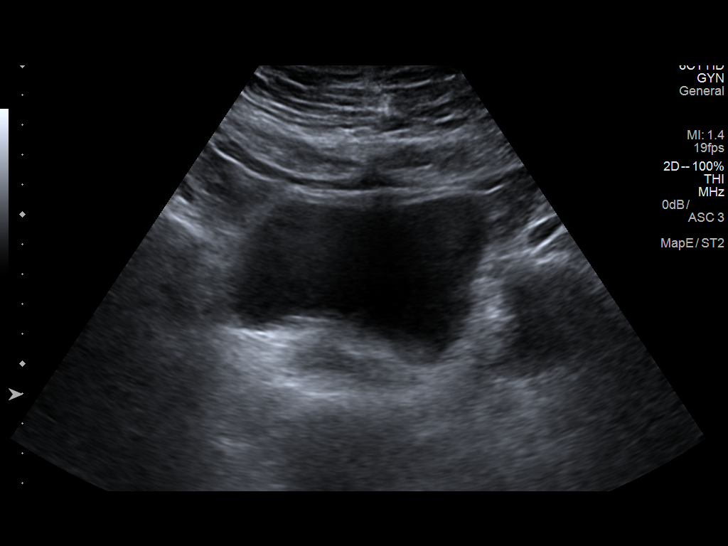
[im 55/165]
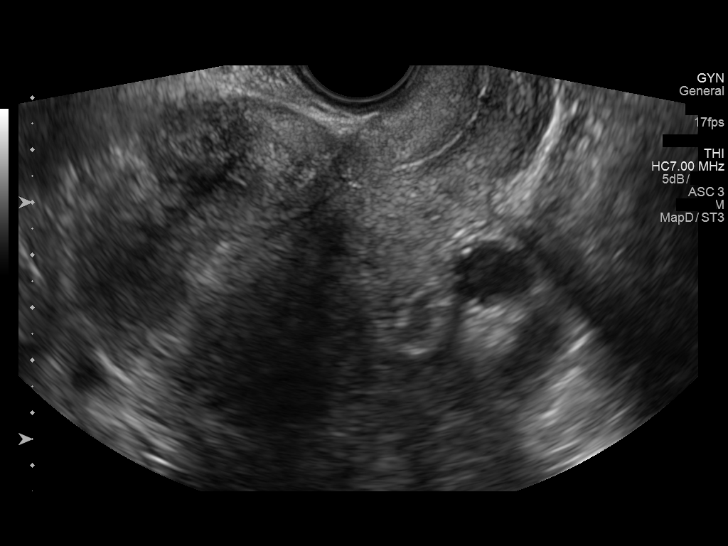
[im 69/165]
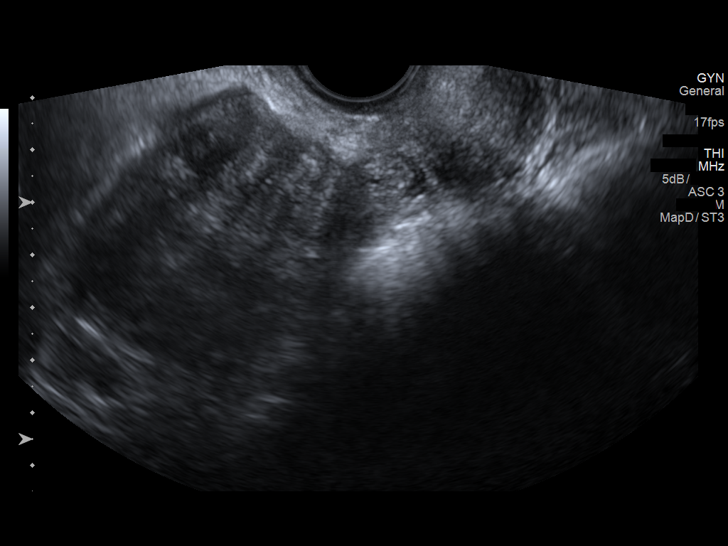
[im 83/165]
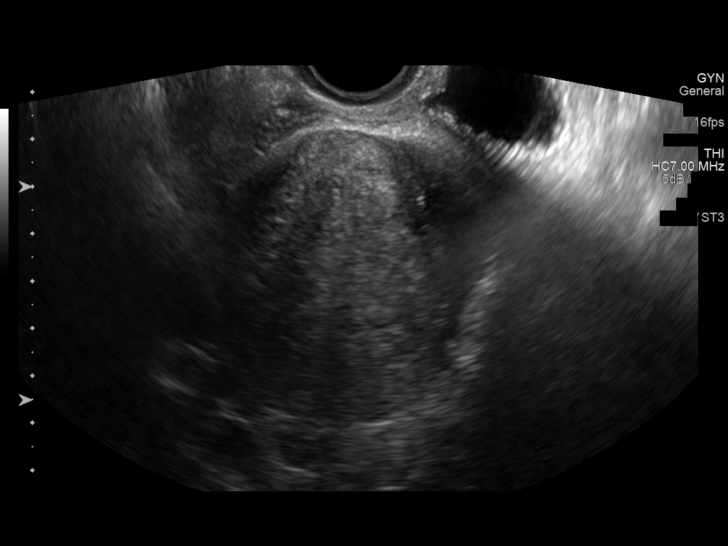
[im 96/165]
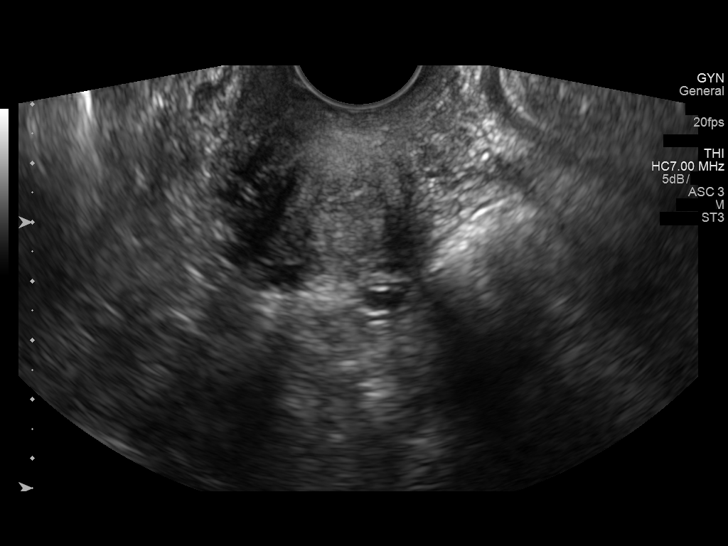
[im 110/165]
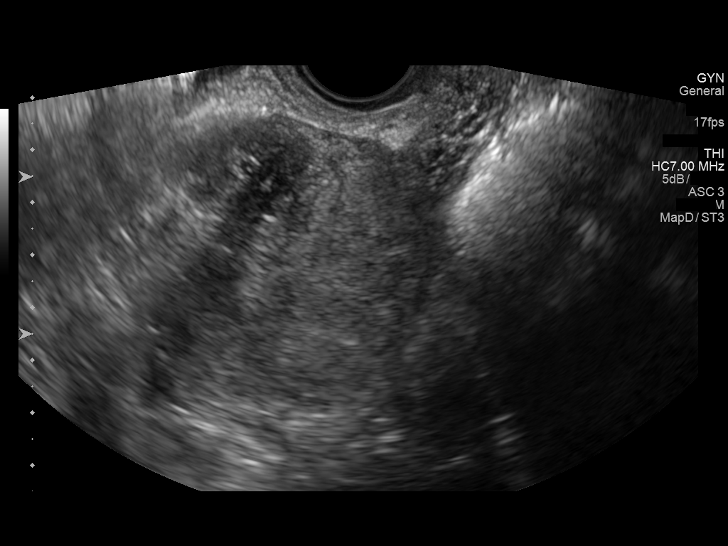
[im 124/165]
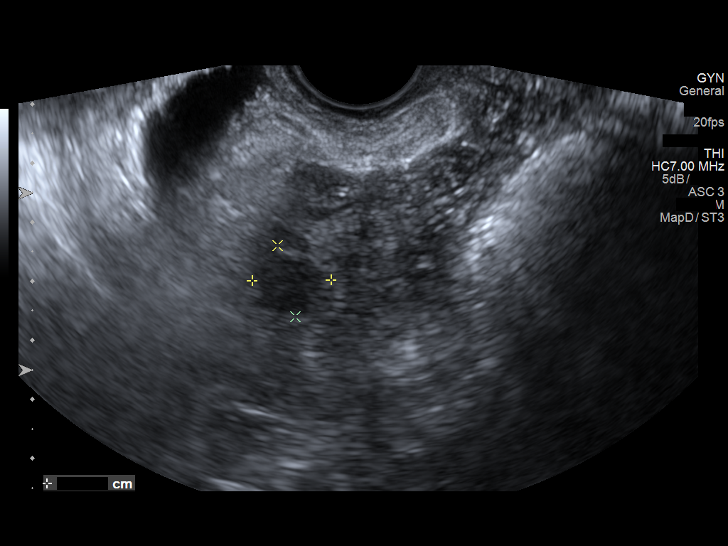
[im 137/165]
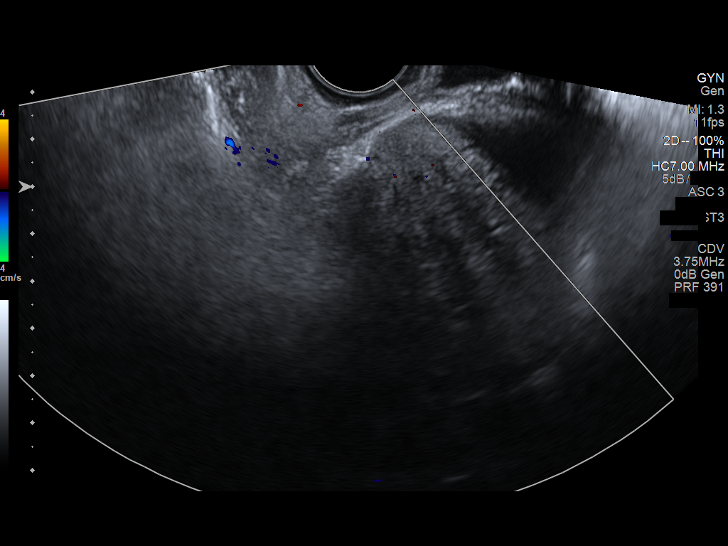
[im 151/165]
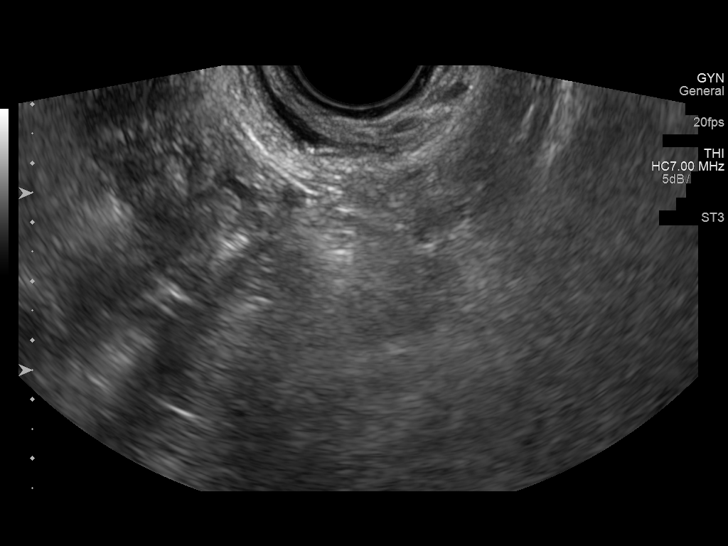
[im 165/165]
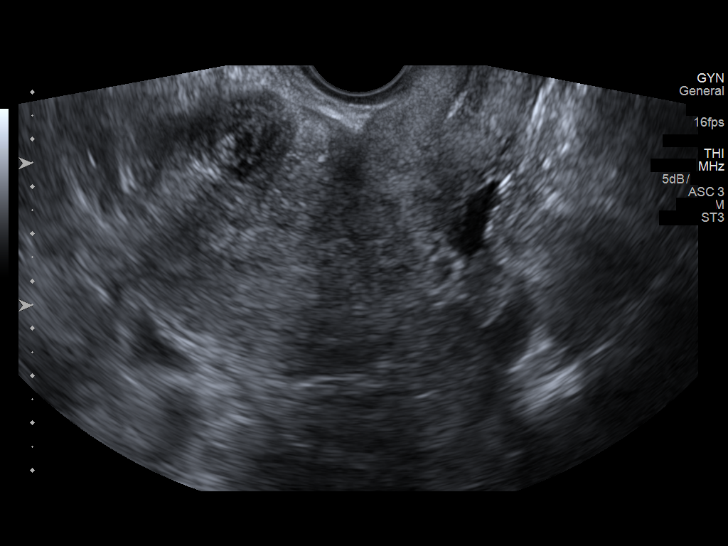

[13 of 25 positions shown; findings below may reference images not displayed]

FINDINGS: Uterus

Measurements: 10.7 x 5.8 x 6.6 cm = volume: 213 mL. Heterogeneous
echogenicity. Scar at anterior wall from prior Caesarean section.
Two small uterine nodules are identified likely representing
leiomyomata, including a 1.4 x 2.3 x 1.7 cm subserosal lesion at the
anterior fundus and an intramural lesion on LEFT at upper uterus
x 1.2 x 1.2 cm.

Endometrium

Thickness: 7 mm.  No endometrial fluid or focal abnormality

Right ovary

Not visualized on either transabdominal or endovaginal imaging,
likely obscured by bowel

Left ovary

Not visualized on either transabdominal or endovaginal imaging,
likely obscured by bowel

Other findings

Trace free pelvic fluid.  No adnexal masses.
IMPRESSION: Prior Caesarean section.

Two small uterine leiomyomata.

Nonvisualization of ovaries.

## 2020-01-03 ENCOUNTER — Other Ambulatory Visit: Payer: Self-pay

## 2020-01-03 ENCOUNTER — Ambulatory Visit: Payer: Self-pay | Attending: Internal Medicine

## 2020-01-03 DIAGNOSIS — Z23 Encounter for immunization: Secondary | ICD-10-CM

## 2020-01-03 NOTE — Progress Notes (Signed)
   Covid-19 Vaccination Clinic  Name:  Jasmine Townsend    MRN: [id number] DOB: 31-May-1976  01/03/2020  [name] was observed post Covid-19 immunization for 15 minutes without incident. She was provided with Vaccine Information Sheet and instruction to access the V-Safe system.   [name] was instructed to call 911 with any severe reactions post vaccine: Marland Kitchen Difficulty breathing  . Swelling of face and throat  . A fast heartbeat  . A bad rash all over body  . Dizziness and weakness   Immunizations Administered    Name Date Dose VIS Date Route   Moderna COVID-19 Vaccine 01/03/2020 11:09 AM 0.5 mL 09/11/2019 Intramuscular   Manufacturer: Moderna   Lot: [id number]   [id number]

## 2020-01-31 ENCOUNTER — Ambulatory Visit: Payer: Self-pay

## 2020-11-25 ENCOUNTER — Encounter: Payer: Self-pay | Admitting: Adult Health

## 2020-12-03 ENCOUNTER — Ambulatory Visit (INDEPENDENT_AMBULATORY_CARE_PROVIDER_SITE_OTHER): Payer: Self-pay | Admitting: Adult Health

## 2020-12-03 ENCOUNTER — Other Ambulatory Visit: Payer: Self-pay

## 2020-12-03 ENCOUNTER — Encounter: Payer: Self-pay | Admitting: Adult Health

## 2020-12-03 VITALS — BP 141/85 | HR 81 | Ht 65.0 in | Wt 227.0 lb

## 2020-12-03 DIAGNOSIS — N920 Excessive and frequent menstruation with regular cycle: Secondary | ICD-10-CM

## 2020-12-03 DIAGNOSIS — N939 Abnormal uterine and vaginal bleeding, unspecified: Secondary | ICD-10-CM

## 2020-12-03 DIAGNOSIS — D219 Benign neoplasm of connective and other soft tissue, unspecified: Secondary | ICD-10-CM

## 2020-12-03 DIAGNOSIS — Z862 Personal history of diseases of the blood and blood-forming organs and certain disorders involving the immune mechanism: Secondary | ICD-10-CM

## 2020-12-03 LAB — POCT HEMOGLOBIN: Hemoglobin: 12.2 g/dL (ref 11–14.6)

## 2020-12-03 MED ORDER — MEGESTROL ACETATE 40 MG PO TABS: ORAL_TABLET | ORAL | 3 refills | Status: AC

## 2020-12-03 NOTE — Progress Notes (Signed)
  Subjective:     Patient ID: Jasmine Townsend, female   DOB: Jun 10, 1976, 45 y.o.   MRN: [id number]  HPI [name] is a 45 year old Hispanic female, married, G4P4, referred by [facility] for AUB. Hispanic female, married, G4P4, referred by [facility] for AUB. She has seen Dr Glo Herring on 2020 for same. HGB 13.2 in January and 11.8 in November. Referred records from health dept. Pt is self pay.  Dr Glo Herring had recommenced IUD, but I asked her today she said she had one one the past and it did not work. She has interrupter with her. PCP is [facility].  Review of Systems Has had bleeding since December -stopped megace first of January Dizzy sometimes if bleeding heavy  Patient denies any headaches, hearing loss, fatigue, blurred vision, shortness of breath, chest pain, abdominal pain, problems with bowel movements, urination, or intercourse. No joint pain or mood swings.    Objective:   Physical Exam BP (!) 141/85 (BP Location: Left Arm, Patient Position: Sitting, Cuff Size: Large)   Pulse 81   Ht 5\' 5"  (1.651 m)   Wt 227 lb (103 kg)   LMP 12/01/2020   BMI 37.77 kg/m  HBG 12.2 Skin warm and dry. Lungs: clear to ausculation bilaterally. Cardiovascular: regular rate and rhythm.   Pelvic: external genitalia is normal in appearance no lesions, vagina: +blood,urethra has no lesions or masses noted, cervix:smooth and bulbous, uterus: normal size, shape and contour, non tender, no masses felt, adnexa: no masses or tenderness noted. Bladder is non tender and no masses felt.  AA is 0 Fall risk is low PHQ 9 score is 1 GAD 7 score is 0  [facility] - 12/03/20 1531      Pregnancy Intention Screening   Does the patient want to become pregnant in the next year? No    Does the patient's partner want to become pregnant in the next year? No    Would the patient like to discuss contraceptive options today? No      Contraception Wrap Up   Current Method Female Sterilization    End Method Female Sterilization    Contraception Counseling Provided No         Examination  chaperoned by Diona Fanti CMA  Assessment:        1. Menorrhagia with regular cycle  Will rx megace 2. Fibroids   3. History of anemia Continue iron  4. Abnormal uterine bleeding (AUB) Will rx megace Meds ordered this encounter  Medications  . megestrol (MEGACE) 40 MG tablet    Sig: Take 2 daily at same time    Dispense:  60 tablet    Refill:  3    Order Specific Question:   Supervising Provider    Answer:   Florian Buff [[id number]]      Plan:     Follow up with me in 4 weeks Review handout on endometrial ablation

## 2020-12-31 ENCOUNTER — Ambulatory Visit: Payer: Self-pay | Admitting: Adult Health

## 2023-01-19 ENCOUNTER — Ambulatory Visit (INDEPENDENT_AMBULATORY_CARE_PROVIDER_SITE_OTHER): Payer: Self-pay

## 2023-01-19 ENCOUNTER — Ambulatory Visit
Admission: EM | Admit: 2023-01-19 | Discharge: 2023-01-19 | Disposition: A | Discharge status: Home / Self Care | Payer: Self-pay | Attending: Family Medicine | Admitting: Family Medicine

## 2023-01-19 DIAGNOSIS — R053 Chronic cough: Secondary | ICD-10-CM

## 2023-01-19 DIAGNOSIS — R059 Cough, unspecified: Secondary | ICD-10-CM

## 2023-01-19 MED ORDER — AZITHROMYCIN 250 MG PO TABS: 250.0000 mg | ORAL_TABLET | Freq: Every day | ORAL | 0 refills | Status: AC

## 2023-01-19 MED ORDER — HYDROCODONE BIT-HOMATROP MBR 5-1.5 MG/5ML PO SOLN: 5.0000 mL | Freq: Four times a day (QID) | ORAL | 0 refills | Status: AC | PRN

## 2023-01-19 NOTE — ED Triage Notes (Signed)
Pt reports she has a cough that she can't get rid of and has phlem x 1 month. Advil and cold meds but no relief.

## 2023-01-19 NOTE — Discharge Instructions (Signed)
Be aware, your cough medication may cause drowsiness. Please do not drive, operate heavy machinery or make important decisions while on this medication, it can cloud your judgement.  

## 2023-01-20 NOTE — ED Provider Notes (Signed)
[facility]   [id number] 01/19/23 Arrival Time: 1625  ASSESSMENT & PLAN:  1. Persistent cough for 3 weeks or longer    I have personally viewed and independently interpreted the imaging studies obtained this visit. No acute changes on CXR.  Given duration of symptoms will treat as below:  Discharge Medication List as of 01/19/2023  6:14 PM     START taking these medications   Details  azithromycin (ZITHROMAX) 250 MG tablet Take 1 tablet (250 mg total) by mouth daily. Take first 2 tablets together, then 1 every day until finished., Starting Wed 01/19/2023, Normal    HYDROcodone bit-homatropine (HYCODAN) 5-1.5 MG/5ML syrup Take 5 mLs by mouth every 6 (six) hours as needed for cough., Starting Wed 01/19/2023, Normal         Follow-up Information     [facility].   Specialty: Urgent Care Why: If worsening or failing to improve as anticipated. Contact information: [address], [address] [place] [address] [phone]                Reviewed expectations re: course of current medical issues. Questions answered. Outlined signs and symptoms indicating need for more acute intervention. Understanding verbalized. After Visit Summary given.   SUBJECTIVE: History from: Patient. Jasmine Townsend is a 47 y.o. female. Pt reports she has a cough that she can't get rid of and has phlem x 1 month. Advil and cold meds but no relief. Denies: fever and difficulty breathing. Normal PO intake without n/v/d. Cough is affecting sleep.  OBJECTIVE: T 98.2 P 74 RR 18 Sp02 96% General appearance: alert; no distress Eyes: PERRLA; EOMI; conjunctiva normal HENT: [place]; AT; without nasal congestion Neck: supple  Lungs: speaks full sentences without difficulty; unlabored; dry cough Extremities: no edema Skin: warm and dry Neurologic: normal gait Psychological: alert and cooperative; normal mood and affect  Imaging: DG Chest 2  View  Result Date: 01/19/2023 CLINICAL DATA:  cough x 1 mo EXAM: CHEST - 2 VIEW COMPARISON:  09/22/2018 FINDINGS: Cardiac silhouette is unremarkable. No pneumothorax or pleural effusion. The lungs are clear. The visualized skeletal structures are unremarkable. IMPRESSION: No acute cardiopulmonary process. Electronically Signed   By: Layla Maw M.D.   On: 01/19/2023 17:35    No Known Allergies  Past Medical History:  Diagnosis Date   Anemia    Social History   Socioeconomic History   Marital status: Married    Spouse name: Not on file   Number of children: Not on file   Years of education: Not on file   Highest education level: Not on file  Occupational History   Not on file  Tobacco Use   Smoking status: Never   Smokeless tobacco: Never  Vaping Use   Vaping Use: Never used  Substance and Sexual Activity   Alcohol use: No   Drug use: No   Sexual activity: Not Currently    Birth control/protection: Surgical    Comment: tubal  Other Topics Concern   Not on file  Social History Narrative   Not on file   Social Determinants of Health   Financial Resource Strain: Medium Risk (12/03/2020)   Overall Financial Resource Strain (CARDIA)    Difficulty of Paying Living Expenses: Somewhat hard  Food Insecurity: No Food Insecurity (12/03/2020)   Hunger Vital Sign    Worried About Running Out of Food in the Last Year: Never true    Ran Out of Food in the Last  Year: Never true  Transportation Needs: No Transportation Needs (12/03/2020)   PRAPARE - Administrator, Civil Service (Medical): No    Lack of Transportation (Non-Medical): No  Physical Activity: Inactive (12/03/2020)   Exercise Vital Sign    Days of Exercise per Week: 0 days    Minutes of Exercise per Session: 0 min  Stress: No Stress Concern Present (12/03/2020)   [employer] of Occupational Health - Occupational Stress Questionnaire    Feeling of Stress : Only a little  Social Connections:  Moderately Integrated (12/03/2020)   Social Connection and Isolation Panel [NHANES]    Frequency of Communication with Friends and Family: More than three times a week    Frequency of Social Gatherings with Friends and Family: More than three times a week    Attends Religious Services: More than 4 times per year    Active Member of [employer] or Organizations: No    Attends Banker Meetings: Never    Marital Status: Married  Catering manager Violence: Not At Risk (12/03/2020)   Humiliation, Afraid, Rape, and Kick questionnaire    Fear of Current or Ex-Partner: No    Emotionally Abused: No    Physically Abused: No    Sexually Abused: No   Family History  Problem Relation Age of Onset   Hypertension Mother    Past Surgical History:  Procedure Laterality Date   CESAREAN SECTION     x 2   CHOLECYSTECTOMY     TUBAL LIGATION       Mardella Layman, MD 01/20/23 0930

## 2023-01-26 ENCOUNTER — Ambulatory Visit
Admission: EM | Admit: 2023-01-26 | Discharge: 2023-01-26 | Disposition: A | Discharge status: Home / Self Care | Payer: Self-pay | Attending: Nurse Practitioner | Admitting: Nurse Practitioner

## 2023-01-26 DIAGNOSIS — R053 Chronic cough: Secondary | ICD-10-CM

## 2023-01-26 MED ORDER — PREDNISONE 20 MG PO TABS: 40.0000 mg | ORAL_TABLET | Freq: Every day | ORAL | 0 refills | Status: AC

## 2023-01-26 MED ORDER — PROMETHAZINE-DM 6.25-15 MG/5ML PO SYRP: 5.0000 mL | ORAL_SOLUTION | Freq: Four times a day (QID) | ORAL | 0 refills | Status: AC | PRN

## 2023-01-26 NOTE — ED Provider Notes (Signed)
[facility]    CSN: [id number] Arrival date & time: 01/26/23  1449      History   Chief Complaint No chief complaint on file.   HPI Jasmine Townsend is a 47 y.o. female.   The history is provided by the patient.   The patient presents for follow-up for continued cough.  Patient states that her cough is been present for at least 3 to 4 weeks.  She states that she was seen in this clinic on 01/19/2023 and was prescribed azithromycin and medication for her cough.  She states that she completed both prescriptions.  She states the cough has gotten "better" but it is still present.  She states the cough is worse at night, and is impacting her sleep.  She denies fever, chills, wheezing, shortness of breath, difficulty breathing, chest pain, abdominal pain, nausea, vomiting, or diarrhea.  Patient states that she does have some throat pain and discomfort due to the excessive coughing.  Patient has not taken any additional medication for her symptoms.  Past Medical History:  Diagnosis Date   Anemia     Patient Active Problem List   Diagnosis Date Noted   Fibroids 12/03/2020   History of anemia 12/03/2020   Abnormal uterine bleeding (AUB) 12/03/2020   Symptomatic anemia 09/22/2018   Class 2 obesity due to excess calories with body mass index (BMI) of 35.0 to 35.9 in adult 09/22/2018   Menometrorrhagia 12/12/2014   Endometrial polyp 12/12/2014   Menorrhagia with regular cycle 10/02/2014   Metrorrhagia 09/30/2014   Thickened endometrium upper 21 cm.  09/30/2014    Past Surgical History:  Procedure Laterality Date   CESAREAN SECTION     x 2   CHOLECYSTECTOMY     TUBAL LIGATION      OB History     Gravida  4   Para  4   Term      Preterm      AB      Living  4      SAB      IAB      Ectopic      Multiple      Live Births               Home Medications    Prior to Admission medications   Medication Sig Start Date End Date Taking? Authorizing  Provider  predniSONE (DELTASONE) 20 MG tablet Take 2 tablets (40 mg total) by mouth daily with breakfast for 5 days. 01/26/23 01/31/23 Yes Jahrell Hamor-Warren, Sadie Haber, NP  promethazine-dextromethorphan (PROMETHAZINE-DM) 6.25-15 MG/5ML syrup Take 5 mLs by mouth 4 (four) times daily as needed for cough. 01/26/23  Yes Jataya Wann-Warren, Sadie Haber, NP  azithromycin (ZITHROMAX) 250 MG tablet Take 1 tablet (250 mg total) by mouth daily. Take first 2 tablets together, then 1 every day until finished. 01/19/23   Mardella Layman, MD  ferrous sulfate 325 (65 FE) MG tablet Take 325 mg by mouth in the morning and at bedtime.    [provider]  HYDROcodone bit-homatropine (HYCODAN) 5-1.5 MG/5ML syrup Take 5 mLs by mouth every 6 (six) hours as needed for cough. 01/19/23   Mardella Layman, MD  megestrol (MEGACE) 40 MG tablet Take 2 daily at same time 12/03/20   Adline Potter, NP    Family History Family History  Problem Relation Age of Onset   Hypertension Mother     Social History Social History   Tobacco Use   Smoking status: Never  Smokeless tobacco: Never  Vaping Use   Vaping Use: Never used  Substance Use Topics   Alcohol use: No   Drug use: No     Allergies   Patient has no known allergies.   Review of Systems Review of Systems Per HPI  Physical Exam Triage Vital Signs ED Triage Vitals  Enc Vitals Group     BP 01/26/23 1522 (!) 155/84     Pulse Rate 01/26/23 1522 77     Resp 01/26/23 1522 16     Temp 01/26/23 1522 99.1 F (37.3 C)     Temp Source 01/26/23 1522 Oral     SpO2 --      Weight --      Height --      Head Circumference --      Peak Flow --      Pain Score 01/26/23 1523 0     Pain Loc --      Pain Edu? --      Excl. in GC? --    No data found.  Updated Vital Signs BP (!) 155/84 (BP Location: Right Arm)   Pulse 77   Temp 99.1 F (37.3 C) (Oral)   Resp 16   LMP 01/02/2023 (Exact Date)   Visual Acuity Right Eye Distance:   Left Eye Distance:    Bilateral Distance:    Right Eye Near:   Left Eye Near:    Bilateral Near:     Physical Exam Vitals and nursing note reviewed.  Constitutional:      General: She is not in acute distress.    Appearance: Normal appearance.  HENT:     Head: Normocephalic.     Right Ear: Tympanic membrane, ear canal and external ear normal.     Left Ear: Tympanic membrane, ear canal and external ear normal.     Nose: Nose normal.     Mouth/Throat:     Mouth: Mucous membranes are moist.     Pharynx: Posterior oropharyngeal erythema present. No oropharyngeal exudate.  Eyes:     Extraocular Movements: Extraocular movements intact.     Conjunctiva/sclera: Conjunctivae normal.     Pupils: Pupils are equal, round, and reactive to light.  Cardiovascular:     Rate and Rhythm: Normal rate and regular rhythm.     Pulses: Normal pulses.     Heart sounds: Normal heart sounds.  Pulmonary:     Effort: Pulmonary effort is normal.     Breath sounds: Normal breath sounds.  Abdominal:     General: Bowel sounds are normal.     Palpations: Abdomen is soft.     Tenderness: There is no abdominal tenderness.  Musculoskeletal:     Cervical back: Normal range of motion.  Skin:    General: Skin is warm and dry.  Neurological:     General: No focal deficit present.     Mental Status: She is alert and oriented to person, place, and time.  Psychiatric:        Mood and Affect: Mood normal.        Behavior: Behavior normal.        Thought Content: Thought content normal.        Judgment: Judgment normal.      UC Treatments / Results  Labs (all labs ordered are listed, but only abnormal results are displayed) Labs Reviewed - No data to display  EKG   Radiology No results found.  Procedures Procedures (including critical care time)  Medications Ordered in UC Medications - No data to display  Initial Impression / Assessment and Plan / UC Course  I have reviewed the triage vital signs and the nursing  notes.  Pertinent labs & imaging results that were available during my care of the patient were reviewed by me and considered in my medical decision making (see chart for details).  The patient is well-appearing, she is in no acute distress, vital signs are stable.  Patient continues with persistent cough.  Patient denies new symptoms such as fever, chills, wheezing, shortness of breath, difficulty breathing.  Patient was seen in this clinic on 01/19/2023 and prescribed Promethazine DM and Hycodan syrup.  Lung sounds are clear on exam.  Will treat patient with prednisone 40 mg for the next 5 days, and Promethazine DM for her cough at nighttime.  Patient was advised that the cough may last for several weeks.  Patient was advised of when follow-up may be indicated.  Patient was advised that if symptoms do not improve with this treatment, it is recommended that she follow-up with her primary care physician for further evaluation.  Patient is in agreement with this plan of care and verbalizes understanding.  All questions were answered.  Patient stable discharge.   Final Clinical Impressions(s) / UC Diagnoses   Final diagnoses:  Persistent cough for 3 weeks or longer     Discharge Instructions      Take medication as prescribed. Increase fluids and allow for plenty of rest. May continue Mucinex or Robitussin during the day for your cough. Recommend use of a humidifier in your bedroom at nighttime during sleep and sleeping elevated on pillows while cough symptoms persist. As discussed, your cough may last for several weeks.  If you are not feeling well, but continue to have a persistent nagging cough, recommend the use of throat lozenges or cough drops along with increasing your water intake while symptoms persist.  If you develop new symptoms such as fever, chills, wheezing, shortness of breath, or difficulty breathing, please follow-up with your primary care physician or go to the emergency  department for further evaluation. If symptoms fail to improve with this treatment, please follow-up with your primary care physician for further evaluation. Follow-up as needed.     ED Prescriptions     Medication Sig Dispense Auth. Provider   predniSONE (DELTASONE) 20 MG tablet Take 2 tablets (40 mg total) by mouth daily with breakfast for 5 days. 10 tablet Cassandria Drew-Warren, Sadie Haber, NP   promethazine-dextromethorphan (PROMETHAZINE-DM) 6.25-15 MG/5ML syrup Take 5 mLs by mouth 4 (four) times daily as needed for cough. 118 mL Kanon Colunga-Warren, Sadie Haber, NP      PDMP not reviewed this encounter.   Abran Cantor, NP 01/26/23 1547

## 2023-01-26 NOTE — ED Triage Notes (Signed)
Pt c/o follow up on cough, states she feels better but not by much, she finished medicine, and still has cough. Pt has pain in throat from coughing, affecting sleep.

## 2023-01-26 NOTE — Discharge Instructions (Addendum)
Take medication as prescribed. Increase fluids and allow for plenty of rest. May continue Mucinex or Robitussin during the day for your cough. Recommend use of a humidifier in your bedroom at nighttime during sleep and sleeping elevated on pillows while cough symptoms persist. As discussed, your cough may last for several weeks.  If you are feeling well, but continue to have a persistent nagging cough, recommend the use of throat lozenges or cough drops along with increasing your water intake while symptoms persist.  If you develop new symptoms such as fever, chills, wheezing, shortness of breath, or difficulty breathing, please follow-up with your primary care physician or go to the emergency department for further evaluation. If symptoms fail to improve with this treatment, please follow-up with your primary care physician for further evaluation. Follow-up as needed.

## 2023-05-16 ENCOUNTER — Other Ambulatory Visit (HOSPITAL_COMMUNITY): Payer: Self-pay | Admitting: *Deleted

## 2023-05-16 DIAGNOSIS — Z1231 Encounter for screening mammogram for malignant neoplasm of breast: Secondary | ICD-10-CM

## 2023-05-17 ENCOUNTER — Other Ambulatory Visit (HOSPITAL_COMMUNITY): Payer: Self-pay | Admitting: *Deleted

## 2023-05-17 DIAGNOSIS — N939 Abnormal uterine and vaginal bleeding, unspecified: Secondary | ICD-10-CM

## 2023-05-23 ENCOUNTER — Ambulatory Visit (HOSPITAL_COMMUNITY)
Admission: RE | Admit: 2023-05-23 | Discharge: 2023-05-23 | Disposition: A | Discharge status: Home / Self Care | Payer: Self-pay | Source: Ambulatory Visit | Attending: *Deleted | Admitting: *Deleted

## 2023-05-23 DIAGNOSIS — Z1231 Encounter for screening mammogram for malignant neoplasm of breast: Secondary | ICD-10-CM | POA: Insufficient documentation

## 2023-05-23 DIAGNOSIS — N939 Abnormal uterine and vaginal bleeding, unspecified: Secondary | ICD-10-CM | POA: Insufficient documentation

## 2023-05-24 ENCOUNTER — Ambulatory Visit (HOSPITAL_COMMUNITY)
Admission: RE | Admit: 2023-05-24 | Discharge: 2023-05-24 | Disposition: A | Discharge status: Home / Self Care | Payer: Self-pay | Source: Ambulatory Visit | Attending: *Deleted | Admitting: *Deleted

## 2023-05-24 DIAGNOSIS — N939 Abnormal uterine and vaginal bleeding, unspecified: Secondary | ICD-10-CM
